# Patient Record
Sex: Female | Born: 1970 | Race: White | Hispanic: No | Marital: Married | Smoking: Former smoker
Health system: Southern US, Community
[De-identification: ages and names within clinical notes are randomized; demographics above are authoritative.]

## PROBLEM LIST (undated history)

## (undated) ENCOUNTER — Inpatient Hospital Stay: Admission: EM | Payer: Self-pay | Source: Home / Self Care

## (undated) DIAGNOSIS — L811 Chloasma: Secondary | ICD-10-CM

## (undated) DIAGNOSIS — F419 Anxiety disorder, unspecified: Secondary | ICD-10-CM

## (undated) DIAGNOSIS — E0500 Thyrotoxicosis with diffuse goiter without thyrotoxic crisis or storm: Secondary | ICD-10-CM

## (undated) DIAGNOSIS — L509 Urticaria, unspecified: Secondary | ICD-10-CM

## (undated) DIAGNOSIS — R87619 Unspecified abnormal cytological findings in specimens from cervix uteri: Secondary | ICD-10-CM

## (undated) DIAGNOSIS — C4491 Basal cell carcinoma of skin, unspecified: Secondary | ICD-10-CM

## (undated) DIAGNOSIS — M2550 Pain in unspecified joint: Secondary | ICD-10-CM

## (undated) DIAGNOSIS — C50919 Malignant neoplasm of unspecified site of unspecified female breast: Secondary | ICD-10-CM

## (undated) DIAGNOSIS — G43909 Migraine, unspecified, not intractable, without status migrainosus: Secondary | ICD-10-CM

## (undated) DIAGNOSIS — I341 Nonrheumatic mitral (valve) prolapse: Secondary | ICD-10-CM

## (undated) HISTORY — DX: Migraine, unspecified, not intractable, without status migrainosus: G43.909

## (undated) HISTORY — DX: Unspecified abnormal cytological findings in specimens from cervix uteri: R87.619

## (undated) HISTORY — DX: Thyrotoxicosis with diffuse goiter without thyrotoxic crisis or storm: E05.00

## (undated) HISTORY — DX: Pain in unspecified joint: M25.50

## (undated) HISTORY — DX: Urticaria, unspecified: L50.9

## (undated) HISTORY — DX: Basal cell carcinoma of skin, unspecified: C44.91

## (undated) HISTORY — DX: Nonrheumatic mitral (valve) prolapse: I34.1

## (undated) HISTORY — DX: Malignant neoplasm of unspecified site of unspecified female breast: C50.919

## (undated) HISTORY — DX: Chloasma: L81.1

## (undated) HISTORY — PX: WISDOM TOOTH EXTRACTION: SHX21

---

## 1997-12-31 DIAGNOSIS — E0500 Thyrotoxicosis with diffuse goiter without thyrotoxic crisis or storm: Secondary | ICD-10-CM

## 1997-12-31 HISTORY — DX: Thyrotoxicosis with diffuse goiter without thyrotoxic crisis or storm: E05.00

## 1998-12-16 ENCOUNTER — Ambulatory Visit (HOSPITAL_COMMUNITY): Admission: RE | Admit: 1998-12-16 | Discharge: 1998-12-16 | Payer: Self-pay | Admitting: Family Medicine

## 2000-02-29 HISTORY — PX: CRYOTHERAPY: SHX1416

## 2001-01-21 ENCOUNTER — Other Ambulatory Visit: Admission: RE | Admit: 2001-01-21 | Discharge: 2001-01-21 | Payer: Self-pay | Admitting: *Deleted

## 2001-05-31 DIAGNOSIS — R87619 Unspecified abnormal cytological findings in specimens from cervix uteri: Secondary | ICD-10-CM

## 2001-05-31 HISTORY — DX: Unspecified abnormal cytological findings in specimens from cervix uteri: R87.619

## 2001-06-02 ENCOUNTER — Other Ambulatory Visit: Admission: RE | Admit: 2001-06-02 | Discharge: 2001-06-02 | Payer: Self-pay | Admitting: Obstetrics and Gynecology

## 2001-06-02 ENCOUNTER — Other Ambulatory Visit: Admission: RE | Admit: 2001-06-02 | Discharge: 2001-06-02 | Payer: Self-pay | Admitting: *Deleted

## 2001-06-16 ENCOUNTER — Ambulatory Visit (HOSPITAL_COMMUNITY): Admission: RE | Admit: 2001-06-16 | Discharge: 2001-06-16 | Payer: Self-pay | Admitting: Endocrinology

## 2001-06-16 ENCOUNTER — Encounter: Payer: Self-pay | Admitting: Endocrinology

## 2001-11-07 ENCOUNTER — Other Ambulatory Visit: Admission: RE | Admit: 2001-11-07 | Discharge: 2001-11-07 | Payer: Self-pay | Admitting: *Deleted

## 2002-09-28 ENCOUNTER — Other Ambulatory Visit: Admission: RE | Admit: 2002-09-28 | Discharge: 2002-09-28 | Payer: Self-pay | Admitting: *Deleted

## 2003-03-29 ENCOUNTER — Other Ambulatory Visit: Admission: RE | Admit: 2003-03-29 | Discharge: 2003-03-29 | Payer: Self-pay | Admitting: *Deleted

## 2003-09-08 ENCOUNTER — Other Ambulatory Visit: Admission: RE | Admit: 2003-09-08 | Discharge: 2003-09-08 | Payer: Self-pay | Admitting: *Deleted

## 2004-04-05 ENCOUNTER — Other Ambulatory Visit: Admission: RE | Admit: 2004-04-05 | Discharge: 2004-04-05 | Payer: Self-pay | Admitting: *Deleted

## 2005-09-20 ENCOUNTER — Other Ambulatory Visit: Admission: RE | Admit: 2005-09-20 | Discharge: 2005-09-20 | Payer: Self-pay | Admitting: *Deleted

## 2007-01-10 ENCOUNTER — Other Ambulatory Visit: Admission: RE | Admit: 2007-01-10 | Discharge: 2007-01-10 | Payer: Self-pay | Admitting: Obstetrics & Gynecology

## 2008-06-30 ENCOUNTER — Other Ambulatory Visit: Admission: RE | Admit: 2008-06-30 | Discharge: 2008-06-30 | Payer: Self-pay | Admitting: Obstetrics & Gynecology

## 2009-11-23 LAB — TSH: TSH: 0 u[IU]/mL — AB (ref <=5.90)

## 2011-01-09 ENCOUNTER — Encounter: Payer: Self-pay | Admitting: Cardiovascular Disease

## 2011-01-09 DIAGNOSIS — E0500 Thyrotoxicosis with diffuse goiter without thyrotoxic crisis or storm: Secondary | ICD-10-CM | POA: Insufficient documentation

## 2011-02-01 ENCOUNTER — Ambulatory Visit: Payer: Self-pay | Admitting: Cardiovascular Disease

## 2011-04-24 ENCOUNTER — Encounter: Payer: Self-pay | Admitting: Cardiovascular Disease

## 2014-02-04 ENCOUNTER — Encounter: Payer: Self-pay | Admitting: Certified Nurse Midwife

## 2014-02-08 ENCOUNTER — Ambulatory Visit: Payer: Self-pay | Admitting: Certified Nurse Midwife

## 2015-09-09 ENCOUNTER — Ambulatory Visit: Payer: Self-pay | Admitting: Certified Nurse Midwife

## 2015-09-14 ENCOUNTER — Encounter: Payer: Self-pay | Admitting: Certified Nurse Midwife

## 2015-09-14 ENCOUNTER — Ambulatory Visit (INDEPENDENT_AMBULATORY_CARE_PROVIDER_SITE_OTHER): Payer: BLUE CROSS/BLUE SHIELD | Admitting: Certified Nurse Midwife

## 2015-09-14 VITALS — BP 98/64 | HR 68 | Resp 16 | Ht 66.25 in | Wt 150.0 lb

## 2015-09-14 DIAGNOSIS — Z01419 Encounter for gynecological examination (general) (routine) without abnormal findings: Secondary | ICD-10-CM

## 2015-09-14 DIAGNOSIS — N898 Other specified noninflammatory disorders of vagina: Secondary | ICD-10-CM

## 2015-09-14 DIAGNOSIS — E0500 Thyrotoxicosis with diffuse goiter without thyrotoxic crisis or storm: Secondary | ICD-10-CM

## 2015-09-14 DIAGNOSIS — N63 Unspecified lump in breast: Secondary | ICD-10-CM | POA: Diagnosis not present

## 2015-09-14 DIAGNOSIS — Z124 Encounter for screening for malignant neoplasm of cervix: Secondary | ICD-10-CM | POA: Diagnosis not present

## 2015-09-14 DIAGNOSIS — N6310 Unspecified lump in the right breast, unspecified quadrant: Secondary | ICD-10-CM

## 2015-09-14 DIAGNOSIS — Z0000 Encounter for general adult medical examination without abnormal findings: Secondary | ICD-10-CM | POA: Diagnosis not present

## 2015-09-14 LAB — POCT URINALYSIS DIPSTICK
Bilirubin, UA: NEGATIVE
Blood, UA: NEGATIVE
Glucose, UA: NEGATIVE
Ketones, UA: NEGATIVE
Leukocytes, UA: NEGATIVE
Nitrite, UA: NEGATIVE
Protein, UA: NEGATIVE
Urobilinogen, UA: NEGATIVE
pH, UA: 5

## 2015-09-14 NOTE — Progress Notes (Signed)
44 y.o. G0P0000 Married  Caucasian Fe here for annual exam. Periods some change with heavy first day, then light and finishes 2 days later. no issues. Contraception working well. Stressful year. Spouse diagnosed with colon cancer 2 B, negative lymph nodes with surgery and chemotherapy/ and herbal supplements and radiation to follow. No Lynch syndrome. Patient managing with support from family/friends. Sees PCP   Patient's last menstrual period was 09/07/2015.          Sexually active: Yes.    The current method of family planning is withdrawal.    Exercising: Yes.    walking & gardening Smoker:  no  Health Maintenance: Pap:  02-03-13 neg MMG: halo done within last yr with thermogram this year. Colonoscopy:  none BMD:   none TDaP: 2009 Labs: poct urine-neg Self breast exam: done monthly   reports that she has never smoked. She does not have any smokeless tobacco history on file. She reports that she does not drink alcohol or use illicit drugs.  Past Medical History  Diagnosis Date  . Grave's disease     GRAVES DISEASE  . Cancer     melanoma  . Migraines     without aura  . Abnormal Pap smear of cervix 6/02    +HPV HR, CIN1  . MVP (mitral valve prolapse)     neg cardio workup  . Melasma     Past Surgical History  Procedure Laterality Date  . Cryotherapy  3/01    CIN1    Current Outpatient Prescriptions  Medication Sig Dispense Refill  . CALCIUM PO Take by mouth as needed.    . Multiple Vitamins-Minerals (MULTIVITAMIN PO) Take by mouth daily.     No current facility-administered medications for this visit.    Family History  Problem Relation Age of Onset  . Hypertension Father   . Diabetes type II Father   . Hyperlipidemia Father   . Diabetes Father   . GER disease Brother   . Gout Brother   . Hypertension Brother   . Pulmonary embolism Maternal Grandfather     ROS:  Pertinent items are noted in HPI.  Otherwise, a comprehensive ROS was negative.  Exam:   BP  98/64 mmHg  Pulse 68  Resp 16  Ht 5' 6.25" (1.683 m)  Wt 150 lb (68.04 kg)  BMI 24.02 kg/m2  LMP 09/07/2015 Height: 5' 6.25" (168.3 cm) Ht Readings from Last 3 Encounters:  09/14/15 5' 6.25" (1.683 m)    General appearance: alert, cooperative and appears stated age Head: Normocephalic, without obvious abnormality, atraumatic Neck: no adenopathy, supple, symmetrical, trachea midline and thyroid normal to inspection and palpation Lungs: clear to auscultation bilaterally Breasts: normal appearance, no masses or tenderness, No nipple retraction or dimpling, No nipple discharge or bleeding, No axillary or supraclavicular adenopathy, positive findings: right breast mass pea size at 12 o'clock at aerola edge non tender,not mobile,  Heart: regular rate and rhythm Abdomen: soft, non-tender; no masses,  no organomegaly Extremities: extremities normal, atraumatic, no cyanosis or edema Skin: Skin color, texture, turgor normal. No rashes or lesions Lymph nodes: Cervical, supraclavicular, and axillary nodes normal. No abnormal inguinal nodes palpated Neurologic: Grossly normal   Pelvic: External genitalia:  no lesions              Urethra:  normal appearing urethra with no masses, tenderness or lesions              Bartholin's and Skene's: normal  Vagina: normal appearing vagina with increased redness at introitus and white non odorous discharge, no lesions Affirm taken              Cervix: normal,non tender,no lesions              Pap taken: Yes.   Bimanual Exam:  Uterus:  normal size, contour, position, consistency, mobility, non-tender              Adnexa: normal adnexa and no mass, fullness, tenderness               Rectovaginal: Confirms               Anus:  normal sphincter tone, no lesions  Chaperone present: yes  A:  Well Woman with normal exam  Contraception withdrawal  Right breast mass  R/O vaginal infection  History of Graves disease  Previous abnormal pap cin1  with cryo 2001  Social stress with spouse cancer  P:   Reviewed health and wellness pertinent to exam  Discussed finding and need for evaluation with [place] and diagnostic 3 D mammogram and [place]. Patient also needs screening on left breast. Patient agreeable to evaluation. Patient will be scheduled before leaving today.  Discussed aveeno bath for comfort and will advise if treatment needed when Affirm results in.  Patient requests referral to Endocrine, is unhappy with current care. Patient will be contacted with referral information.  Encouraged to seek family and friend and cancer center support group.  Pap smear as above with HPVHR   counseled on breast self exam, mammography screening, adequate intake of calcium and vitamin D, diet and exercise  return annually or prn  An After Visit Summary was printed and given to the patient.

## 2015-09-14 NOTE — Patient Instructions (Signed)

## 2015-09-14 NOTE — Progress Notes (Signed)
Reviewed personally.  M. Suzanne Leoni Goodness, MD.  

## 2015-09-14 NOTE — Progress Notes (Signed)
Scheduled patient for bilateral diagnostic mammogram with right breast ultrasound with [facility] on 09/19/2015 at 10:15 am. Agreeable to date and time. Placed in mammogram hold.

## 2015-09-15 LAB — WET PREP BY MOLECULAR PROBE
Candida species: NEGATIVE
Gardnerella vaginalis: NEGATIVE
Trichomonas vaginosis: NEGATIVE

## 2015-09-16 LAB — IPS PAP TEST WITH HPV

## 2015-09-20 ENCOUNTER — Other Ambulatory Visit: Payer: Self-pay | Admitting: Radiology

## 2015-09-21 ENCOUNTER — Other Ambulatory Visit: Payer: Self-pay

## 2015-09-21 ENCOUNTER — Other Ambulatory Visit: Payer: Self-pay | Admitting: Radiology

## 2015-09-21 DIAGNOSIS — C50911 Malignant neoplasm of unspecified site of right female breast: Secondary | ICD-10-CM

## 2015-09-22 ENCOUNTER — Encounter: Payer: Self-pay | Admitting: *Deleted

## 2015-09-22 NOTE — Progress Notes (Signed)
[place] Work  Clinical Social Work was referred by patient for assessment of psychosocial needs due to possible breast cancer diagnosis.  Clinical Social Worker spoke with pt via phone and then later met in person with met with patient at [facility] while husband receiving chemo to offer support and assess for needs.  CSW reviewed options for additional support/services for her at [facility]. Pt open to Loss adjuster, chartered after clinic visit, plans to attend support group Monday and meet with CSW to complete ADRs on 09/30/15. Pt able to openly communicate and advocate for her needs. CSW team to continue to follow and support.   Clinical Social Work interventions: Supportive listening Resource education  Amber Pugh, [place] Worker [place]  [place] Phone: [phone] Fax: [phone]

## 2015-09-23 ENCOUNTER — Ambulatory Visit
Admission: RE | Admit: 2015-09-23 | Discharge: 2015-09-23 | Disposition: A | Discharge status: Home / Self Care | Payer: BLUE CROSS/BLUE SHIELD | Source: Ambulatory Visit | Attending: Radiology | Admitting: Radiology

## 2015-09-23 ENCOUNTER — Encounter: Payer: Self-pay | Admitting: *Deleted

## 2015-09-23 ENCOUNTER — Telehealth: Payer: Self-pay | Admitting: *Deleted

## 2015-09-23 DIAGNOSIS — C50911 Malignant neoplasm of unspecified site of right female breast: Secondary | ICD-10-CM

## 2015-09-23 DIAGNOSIS — C50411 Malignant neoplasm of upper-outer quadrant of right female breast: Secondary | ICD-10-CM | POA: Insufficient documentation

## 2015-09-23 MED ORDER — GADOBENATE DIMEGLUMINE 529 MG/ML IV SOLN: 14.0000 mL | Freq: Once | INTRAVENOUS | Status: DC | PRN

## 2015-09-23 NOTE — Telephone Encounter (Signed)
Confirmed [facility] for 09/28/15 at 830am .  Instructions and contact information given.

## 2015-09-27 NOTE — Progress Notes (Signed)
[place]  Telephone:([phone] Fax:([phone]) [place] Note   Patient Care Team: Carlos Levering, PA-C as PCP - General (Family Medicine) Rolm Bookbinder, MD as Consulting Physician (General Surgery) Truitt Merle, MD as Consulting Physician (Hematology) Thea Silversmith, MD as Consulting Physician (Radiation Oncology) Mauro Kaufmann, RN as Registered Nurse Rockwell Germany, RN as Registered Nurse Sylvan Cheese, NP as Nurse Practitioner (Nurse Practitioner) 09/28/2015  CHIEF COMPLAINTS/PURPOSE OF CONSULTATION:  Newly diagnosed right breast cancer    Oncology History   Breast cancer of upper-outer quadrant of right female breast   Staging form: Breast, AJCC 7th Edition     Clinical stage from 09/20/2015: Stage IA (T1c, N0, M0) - Unsigned       Breast cancer of upper-outer quadrant of right female breast   09/20/2015 Initial Biopsy Right breast core needle biopsy 12:00 position showed invasive lobular carcinoma, second biopsy at 9:00 showed similar invasive lobular carcinoma, grade 1.   09/20/2015 Receptors her2 ER 70% positive, PR 90% positive, HER-2 negative, Ki-67 20%   09/20/2015 Initial Diagnosis Breast cancer of upper-outer quadrant of right female breast   09/26/2015 Imaging breast MRI confirmed biopsy-proven right breast 2 cm lesion at 12:00, and 1.3 cm lesion at 9:00 clock, and additional 0.9 cm non-mass enhancement at 6:00 position of the right breast, and oral enhancing mass at the 7:00 of the left breast measuring 0.7 cm    HISTORY OF PRESENTING ILLNESS:  Amber Pugh 44 y.o. female is here because of her recently diagnosed right breast cancer. She is accompanied by her parents in low to our multidisciplinary breast clinic today.  She went to see her GYN nurse practitioner a few weeks ago for routine follow-up, breast exam reviewed a palpable right breast mass at 12:00 position. Patient does not feel the mass before her visit.  This prompted a mammogram, which was her first mammogram in her life, on 09/19/2015. Which showed a 2.5 cm irregular hyperdense mass in the right breast 12:00 position, and a 8 mm mass at night clock position, which is suspicious for malignancy. She underwent ultrasound-guided needle biopsy of post the 12:00 and 9 clock position mass, both showed invasive lobular carcinoma. She was underwent breast MRI on 09/26/2015, which confirmed the biopsy-proven right breast cancer and 12 and 9 clock position, an additional a non-mass enhancement at the 6:00 position of right breast, and an oval enhancing mass at 7:00 of the left breast measuring 0.7 cm. The second look ultrasound of the left breast was negative for masses.  Her past medical history was positive for Graves' disease, which she was treated with medications and has been in remission for the past 3 years. She is very active, a true believer of healthy diet and herb supplement and nutritional supplement. Her husband was diagnosed with stage II colon cancer, and is currently undergoing adjuvant chemotherapy under my care. I've known her since 3 months ago.  MEDICAL HISTORY:  Past Medical History  Diagnosis Date  . Grave's disease     GRAVES DISEASE  . Migraines     without aura  . Abnormal Pap smear of cervix 6/02    +HPV HR, CIN1  . MVP (mitral valve prolapse)     neg cardio workup  . Melasma   . Basal cell carcinoma   . Breast cancer   . Graves disease   . Joint pain   . Graves disease 1999    she was treatmented with medications  3 times, in remission for the past 3 years ago    SURGICAL HISTORY: Past Surgical History  Procedure Laterality Date  . Cryotherapy  3/01    CIN1   GYN HISTORY  Menarchal: 15 LMP: 09/07/2015 Contraceptive: no HRT: no G0P0:   SOCIAL HISTORY: Social History   Social History  . Marital Status: Married    Spouse Name: N/A  . Number of Children: N/A  . Years of Education: N/A   Occupational History    . Not on file.   Social History Main Topics  . Smoking status: Never Smoker   . Smokeless tobacco: Not on file  . Alcohol Use: No  . Drug Use: No  . Sexual Activity:    Partners: Male     Comment: withdrawal   Other Topics Concern  . Not on file   Social History Narrative    FAMILY HISTORY: Family History  Problem Relation Age of Onset  . Hypertension Father   . Diabetes Father   . Gout Brother   . Hypertension Brother   . Pulmonary embolism Maternal Grandfather   . Diabetes Maternal Grandfather     ALLERGIES:  is allergic to cefdinir and tetracyclines & related.  MEDICATIONS:  Current Outpatient Prescriptions  Medication Sig Dispense Refill  . ASTRAGALUS PO Take 500 mg by mouth 2 (two) times daily.    . Multiple Vitamins-Minerals (MULTIVITAMIN PO) Take by mouth daily.    Jonna Coup Leaf Extract 500 MG CAPS Take 1 capsule by mouth 2 (two) times daily.    Marland Kitchen UNABLE TO FIND Rosemary , Tumeric, ginger  Two to Three times/day    . CALCIUM PO Take by mouth as needed.     No current facility-administered medications for this visit.    REVIEW OF SYSTEMS:   Constitutional: Denies fevers, chills or abnormal night sweats Eyes: Denies blurriness of vision, double vision or watery eyes Ears, nose, mouth, throat, and face: Denies mucositis or sore throat Respiratory: Denies cough, dyspnea or wheezes Cardiovascular: Denies palpitation, chest discomfort or lower extremity swelling Gastrointestinal:  Denies nausea, heartburn or change in bowel habits Skin: Denies abnormal skin rashes Lymphatics: Denies new lymphadenopathy or easy bruising Neurological:Denies numbness, tingling or new weaknesses Behavioral/Psych: Mood is stable, no new changes  All other systems were reviewed with the patient and are negative.  PHYSICAL EXAMINATION: ECOG PERFORMANCE STATUS: 0 - Asymptomatic  Filed Vitals:   09/28/15 0836  BP: 115/65  Pulse: 66  Temp: 98.4 F (36.9 C)  Resp: 20   Filed  Weights   09/28/15 0836  Weight: 147 lb 14.4 oz (67.087 kg)    GENERAL:alert, no distress and comfortable SKIN: skin color, texture, turgor are normal, no rashes or significant lesions EYES: normal, conjunctiva are pink and non-injected, sclera clear OROPHARYNX:no exudate, no erythema and lips, buccal mucosa, and tongue normal  NECK: supple, thyroid normal size, non-tender, without nodularity LYMPH:  no palpable lymphadenopathy in the cervical, axillary or inguinal LUNGS: clear to auscultation and percussion with normal breathing effort HEART: regular rate & rhythm and no murmurs and no lower extremity edema ABDOMEN:abdomen soft, non-tender and normal bowel sounds Musculoskeletal:no cyanosis of digits and no clubbing  PSYCH: alert & oriented x 3 with fluent speech NEURO: no focal motor/sensory deficits  LABORATORY DATA:  I have reviewed the data as listed Lab Results  Component Value Date   WBC 5.3 09/28/2015   HGB 14.3 09/28/2015   HCT 41.6 09/28/2015   MCV 90.2 09/28/2015   PLT  206 09/28/2015    Recent Labs  09/28/15 0818  NA 138  K 3.9  CO2 23  GLUCOSE 101  BUN 11.6  CREATININE 0.9  CALCIUM 9.1  PROT 7.5  ALBUMIN 4.1  AST 15  ALT 15  ALKPHOS 43  BILITOT 0.61    PATHOLOGY REPORT  Diagnosis 1. Breast, right, needle core biopsy, 12:00 o'clock - INVASIVE MAMMARY CARCINOMA - SEE COMMENT. 2. Breast, right, needle core biopsy, 9:00 o'clock - INVASIVE MAMMARY CARCINOMA. - SEE COMMENT. Microscopic Comment 1. The carcinoma appears grade 1 and has lobular features. An E-Cadherin stain will be performed and the results reported separately. A breast prognostic profile will be performed. The results were called to [facility] on 09/21/2015. 2. The carcinoma in part 2 is morphologically similar to that seen in part 1. (JBK:kh 09-21-15) The malignant cells are negative for E-Cadherin, supporting a lobular phenotype.  Results: IMMUNOHISTOCHEMICAL AND  MORPHOMETRIC ANALYSIS PERFORMED MANUALLY Estrogen Receptor: 70%, POSITIVE, STRONG STAINING INTENSITY Progesterone Receptor: 90%, POSITIVE, STRONG STAINING INTENSITY Proliferation Marker Ki67: 20%  1. FLUORESCENCE IN-SITU HYBRIDIZATION Results: HER2 - NEGATIVE RATIO OF HER2/CEP17 SIGNALS 1.29 AVERAGE HER2 COPY NUMBER PER CELL 2.00  RADIOGRAPHIC STUDIES: I have personally reviewed the radiological images as listed and agreed with the findings in the report.  Mr Breast Bilateral W Wo Contrast 09/26/2015   ADDENDUM REPORT: 09/26/2015 17:25 ADDENDUM: After further review, there is a tubular enhancing lesion at the skin surface of the upper inner right breast, best seen on the 3 minute postcontrast subtracted sequence (series 10, images 37 through 40). This may be a venous structure as it is not seen on the initial postcontrast 1 minute subtracted sequence. Other benign lesion such as a complicated sebaceous cyst is also a possibility. However, additional multicentric disease cannot be confidently excluded and I would recommend a second look targeted ultrasound of this area if breast conservation therapy for the right breast was being considered. Electronically Signed   By: Franki Cabot M.D.   On: 09/26/2015 17:25  09/26/2015    FINDINGS: Breast composition: c. Heterogeneous fibroglandular tissue.  Background parenchymal enhancement: Mild  Right breast:  There is an enhancing spiculated mass within the right breast at the 12 o'clock axis, at middle depth, with mixed kinetics (mostly persistent but some washout), measuring 1.2 x 1.2 x 2 cm (AP by transverse by craniocaudal dimensions), with associated biopsy clip artifact, consistent with KNOWN BIOPSY-PROVEN INVASIVE MAMMARY CARCINOMA (series 10, images 59 through 73).  There are several small satellite nodules along the inferior and posterior aspects of this biopsy-proven carcinoma, most distant satellite nodule being a 0.4 cm focus located 1.4 cm  posterior-lateral from the dominant mass within the upper outer quadrant (this furthest satellite seen on series 10, images 63 through 66).  There is an oval enhancing mass within the right breast at the 9 o'clock axis, at posterior depth, with mixed kinetics (mostly persistent), measuring 0.8 x 0.7 x 1.3 cm (transverse by AP by craniocaudal dimensions), with associated biopsy clip artifact, consistent with KNOWN BIOPSY-PROVEN INVASIVE MAMMARY CARCINOMA (series 10, image 81).  There is focal non mass enhancement within the right breast at the 6 o'clock axis, at posterior depth, with mixed kinetics (mostly persistent), measuring 0.9 x 0.4 cm, suspicious for additional multicentric disease (series 10, images 90 through 93).  There is no other area of suspicious mass or non mass enhancement within the right breast.  There are scattered cysts within the right breast.  Left breast: No  mass or abnormal enhancement.  There is an oval enhancing mass within the lower inner quadrant of the left breast, 7 o'clock axis region, at anterior depth, demonstrating sub threshold persistent kinetics, T2 hyperintense, measuring 0.7 x 0.4 x 0.7 cm (AP by transverse by craniocaudal dimensions), suspicious for contralateral disease (series 10, image 78).  There is no other area of suspicious mass or non mass enhancement within the left breast.  There are scattered cysts within the left breast.  Lymph nodes: There are no enlarged or morphologically abnormal lymph nodes identified within the bilateral axillary or internal mammary chain regions.  Ancillary findings:  None.   IMPRESSION: 1. BIOPSY-PROVEN INVASIVE MAMMARY CARCINOMA (spiculated enhancing mass) within the right breast at the 12 o'clock axis, at middle depth, measuring 1.2 x 1.2 x 2 cm, with biopsy clip. This corresponds to the venous clip on outside postprocedure mammogram of 09/20/2015. 2. BIOPSY-PROVEN INVASIVE MAMMARY CARCINOMA (oval enhancing mass) within the right breast  at the 9 o'clock axis, at posterior depth, measuring 0.8 x 0.7 x 1.3 cm, with associated biopsy clip. This corresponds to the ribbon clip on outside postprocedure mammogram of 09/20/2015. 3. Multiple small satellite nodules along the inferior and posterior aspects of the biopsy-proven carcinoma at the 12 o'clock axis, most distant satellite nodule measuring 0.4 cm located 1.4 cm posterior-lateral to the dominant mass. 4. Focal non mass enhancement within the RIGHT breast at the 6 o'clock axis, at posterior depth, measuring 0.9 x 0.4 cm, suspicious for additional multicentric disease, for which MRI-guided biopsy is recommended to defined furthest possible multicentric disease if such finding will affect treatment. 5. Oval enhancing mass within the LEFT breast, lower inner quadrant, 7 o'clock axis region, at anterior depth, measuring 0.7 x 0.4 x 0.7 cm, suspicious for contralateral disease, for which targeted second-look ultrasound is recommended with possible ultrasound-guided core biopsy. If no sonographic correlate were found, would then recommend MRI-guided biopsy. 6. On outside ultrasound ultrasound report of 09/19/2015, an additional mass was identified within the RIGHT breast at the 10 o'clock axis, 4 cm from the nipple, described as similar in appearance to the mass at the 9 o'clock axis (now a biopsy-proven carcinoma), measuring 0.4 x 0.4 x 0.4 cm. There is no MRI correlate identified today. No guidance given on the ultrasound or postprocedure mammogram report for this additional finding but I now recommend ultrasound-guided biopsy to define additional disease if such finding will affect treatment.  RECOMMENDATION: 1. MRI-guided core biopsy recommended for the focal non mass enhancement within the RIGHT breast at the 6 o'clock axis, at posterior depth, measuring 0.9 x 0.4 cm, suspicious for additional multicentric disease, if such finding will affect treatment. 2. Targeted second-look ultrasound, with  possible ultrasound-guided core biopsy, for an oval enhancing mass within the LEFT breast, lower inner quadrant, 7 o'clock axis region, at anterior depth, measuring 0.7 x 0.4 x 0.7 cm, suspicious for contralateral disease. If no sonographic correlate were found, MRI-guided core biopsy would then be recommended. 3. Ultrasound-guided core biopsy for an additional mass identified on outside ultrasound of 09/19/2015, located within the RIGHT breast at the 10 o'clock axis, 4 cm from the nipple, measuring 0.4 x 0.4 x 0.4 cm, described on outside report as similar in appearance to the mass at the 9 o'clock axis (now a biopsy-proven carcinoma).  BI-RADS CATEGORY  4: Suspicious.  Electronically Signed: By: Franki Cabot M.D. On: 09/25/2015 13:44    ASSESSMENT & PLAN: 44 year old premenopausal Caucasian female  1. Multifocal right breast invasive  lobular carcinoma, mcT2N0M0, stage II, G2, ER+/PR+/HER2- -I reviewed her last imaging findings and core needle biopsy results in great details with patient and her parents in[place]. -She has multifocal right breast cancer, and at least one additional indeterminate lesion in the right breast. Given the multi-focal location, and lobular carcinoma, we recommend right breast mastectomy and sentinel lymph node biopsy. She was seen by breast surgeon Dr. Donne Hazel today. -We recommend her to biopsy the left breast lesion under MRI guidance. -Given her young age, she will be qualified for genetic testing to ruled out inheritable breast cancer, although she does not have family history of breast or ovary cancer. -I recommend Oncotype DX test to further evaluate her risk of cancer recurrence after surgery, and the benefit of adjuvant chemotherapy. -Given her strong ER/PR positivity on her tumor cells, I strongly recommend adjuvant antiestrogen therapy. We discussed the option of tamoxifen or over and suppression plus aromatase inhibitor. The benefit and risks of each therapy were  discussed with her, and she is very interested. -She was also seen by radiation oncologist Dr. Pablo Ledger today.   2. Grave's disease -She'll continue follow-up with her primary care physician, she is in the process to find a new endocrinologist.  Plan -Genetic referral -MRI guided left breast mass biopsy -She is likely going to have a right mastectomy and sentinel lymph node biopsy.  -Dependent on the genetic test results and left breast mass biopsy results, we'll decide if she needs left breast surgery -Oncotype DX of the largest or highest grade breast tumor, if the sentinel lymph node negative -I'll see her back in 3 weeks after her breast surgery.  All questions were answered. The patient knows to call the clinic with any problems, questions or concerns. I spent 55 minutes counseling the patient face to face. The total time spent in the appointment was 60 minutes and more than 50% was on counseling.     Truitt Merle, MD 09/28/2015 6:00 PM

## 2015-09-28 ENCOUNTER — Other Ambulatory Visit: Payer: Self-pay | Admitting: Radiology

## 2015-09-28 ENCOUNTER — Encounter: Payer: Self-pay | Admitting: Skilled Nursing Facility1

## 2015-09-28 ENCOUNTER — Ambulatory Visit
Admission: RE | Admit: 2015-09-28 | Discharge: 2015-09-28 | Disposition: A | Discharge status: Home / Self Care | Payer: BLUE CROSS/BLUE SHIELD | Source: Ambulatory Visit | Attending: Radiation Oncology | Admitting: Radiation Oncology

## 2015-09-28 ENCOUNTER — Encounter: Payer: Self-pay | Admitting: Physical Therapy

## 2015-09-28 ENCOUNTER — Other Ambulatory Visit (HOSPITAL_BASED_OUTPATIENT_CLINIC_OR_DEPARTMENT_OTHER): Payer: BLUE CROSS/BLUE SHIELD

## 2015-09-28 ENCOUNTER — Encounter: Payer: Self-pay | Admitting: Hematology

## 2015-09-28 ENCOUNTER — Ambulatory Visit: Payer: BLUE CROSS/BLUE SHIELD | Attending: General Surgery | Admitting: Physical Therapy

## 2015-09-28 ENCOUNTER — Ambulatory Visit (HOSPITAL_BASED_OUTPATIENT_CLINIC_OR_DEPARTMENT_OTHER): Payer: BLUE CROSS/BLUE SHIELD | Admitting: Hematology

## 2015-09-28 ENCOUNTER — Encounter: Payer: Self-pay | Admitting: Nurse Practitioner

## 2015-09-28 VITALS — BP 115/65 | HR 66 | Temp 98.4°F | Resp 20 | Ht 66.25 in | Wt 147.9 lb

## 2015-09-28 DIAGNOSIS — Z170 Estrogen receptor positive status [ER+]: Secondary | ICD-10-CM

## 2015-09-28 DIAGNOSIS — R202 Paresthesia of skin: Secondary | ICD-10-CM | POA: Insufficient documentation

## 2015-09-28 DIAGNOSIS — E0789 Other specified disorders of thyroid: Secondary | ICD-10-CM

## 2015-09-28 DIAGNOSIS — M5382 Other specified dorsopathies, cervical region: Secondary | ICD-10-CM | POA: Diagnosis present

## 2015-09-28 DIAGNOSIS — C50911 Malignant neoplasm of unspecified site of right female breast: Secondary | ICD-10-CM | POA: Insufficient documentation

## 2015-09-28 DIAGNOSIS — C50811 Malignant neoplasm of overlapping sites of right female breast: Secondary | ICD-10-CM

## 2015-09-28 DIAGNOSIS — R200 Anesthesia of skin: Secondary | ICD-10-CM

## 2015-09-28 DIAGNOSIS — M629 Disorder of muscle, unspecified: Secondary | ICD-10-CM

## 2015-09-28 DIAGNOSIS — R293 Abnormal posture: Secondary | ICD-10-CM | POA: Diagnosis present

## 2015-09-28 DIAGNOSIS — C50411 Malignant neoplasm of upper-outer quadrant of right female breast: Secondary | ICD-10-CM

## 2015-09-28 DIAGNOSIS — R9389 Abnormal findings on diagnostic imaging of other specified body structures: Secondary | ICD-10-CM

## 2015-09-28 LAB — CBC WITH DIFFERENTIAL/PLATELET
BASO%: 0.8 % (ref 0.0–2.0)
Basophils Absolute: 0 10*3/uL (ref 0.0–0.1)
EOS%: 2.5 % (ref 0.0–7.0)
Eosinophils Absolute: 0.1 10*3/uL (ref 0.0–0.5)
HCT: 41.6 % (ref 34.8–46.6)
HEMOGLOBIN: 14.3 g/dL (ref 11.6–15.9)
LYMPH%: 37 % (ref 14.0–49.7)
MCH: 31 pg (ref 25.1–34.0)
MCHC: 34.4 g/dL (ref 31.5–36.0)
MCV: 90.2 fL (ref 79.5–101.0)
MONO#: 0.3 10*3/uL (ref 0.1–0.9)
MONO%: 6.3 % (ref 0.0–14.0)
NEUT%: 53.4 % (ref 38.4–76.8)
NEUTROS ABS: 2.8 10*3/uL (ref 1.5–6.5)
Platelets: 206 10*3/uL (ref 145–400)
RBC: 4.61 10*6/uL (ref 3.70–5.45)
RDW: 12.2 % (ref 11.2–14.5)
WBC: 5.3 10*3/uL (ref 3.9–10.3)
lymph#: 1.9 10*3/uL (ref 0.9–3.3)

## 2015-09-28 LAB — COMPREHENSIVE METABOLIC PANEL (CC13)
ALT: 15 U/L (ref 0–55)
AST: 15 U/L (ref 5–34)
Albumin: 4.1 g/dL (ref 3.5–5.0)
Alkaline Phosphatase: 43 U/L (ref 40–150)
Anion Gap: 8 mEq/L (ref 3–11)
BILIRUBIN TOTAL: 0.61 mg/dL (ref 0.20–1.20)
BUN: 11.6 mg/dL (ref 7.0–26.0)
CO2: 23 meq/L (ref 22–29)
CREATININE: 0.9 mg/dL (ref 0.6–1.1)
Calcium: 9.1 mg/dL (ref 8.4–10.4)
Chloride: 107 mEq/L (ref 98–109)
EGFR: 82 mL/min/{1.73_m2} — ABNORMAL LOW (ref >90)
GLUCOSE: 101 mg/dL (ref 70–140)
Potassium: 3.9 mEq/L (ref 3.5–5.1)
SODIUM: 138 meq/L (ref 136–145)
TOTAL PROTEIN: 7.5 g/dL (ref 6.4–8.3)

## 2015-09-28 NOTE — Therapy (Signed)
[place], [place], [address] Phone: [phone]   Fax:  [phone]  Physical Therapy Evaluation  Patient Details  Name: Amber Pugh MRN: [id number] Date of Birth: 03-14-1971 Referring [place] Baris:  Rolm Bookbinder, MD  Encounter Date: 09/28/2015      PT End of Session - 09/28/15 1253    Visit Number 1   Number of Visits 16   Date for PT Re-Evaluation 11/23/15   PT Start Time 1000   PT Stop Time 1016  Also saw pt from 1115-1136   PT Time Calculation (min) 16 min   Activity Tolerance Patient tolerated treatment well   Behavior During Therapy [facility] for tasks assessed/performed      Past Medical History  Diagnosis Date  . Grave's disease     GRAVES DISEASE  . Migraines     without aura  . Abnormal Pap smear of cervix 6/02    +HPV HR, CIN1  . MVP (mitral valve prolapse)     neg cardio workup  . Melasma   . Basal cell carcinoma   . Breast cancer   . Graves disease   . Joint pain   . Graves disease 1999    she was treatmented with medications 3 times, in remission for the past 3 years ago    Past Surgical History  Procedure Laterality Date  . Cryotherapy  3/01    CIN1    There were no vitals filed for this visit.  Visit Diagnosis:  Musculoskeletal disorder involving upper trapezius muscle - Plan: PT plan of care cert/re-cert  Breast cancer, right - Plan: PT plan of care cert/re-cert  Abnormal posture - Plan: PT plan of care cert/re-cert  Right arm numbness - Plan: PT plan of care cert/re-cert      Subjective Assessment - 09/28/15 1319    Pain Descriptors / Indicators Numbness;Tightness  Numbness extends at times from her scapula down into her right fingertips            [facility] PT Assessment - 09/28/15 0001    Assessment   Medical Diagnosis Right and possibly left breast cancer; right upper trap pain   Onset Date/Surgical Date 09/19/15   Hand Dominance Right   Prior Therapy none recent   Precautions   Precautions Other (comment)  Active cancer   Restrictions   Weight Bearing Restrictions No   Balance Screen   Has the patient fallen in the past 6 months No   Has the patient had a decrease in activity level because of a fear of falling?  No   Is the patient reluctant to leave their home because of a fear of falling?  No   Home Ecologist residence   Living Arrangements Spouse/significant other   Available Help at Discharge Family   Prior Function   Level of [place] Unemployed   Leisure She does alot of gardening, does yoga or [place] 2-3x/week and walks 2-3x/week for 30-45 minutes   Cognition   Overall Cognitive Status Within Functional Limits for tasks assessed   Posture/Postural Control   Posture/Postural Control Postural limitations   Postural Limitations Rounded Shoulders;Forward head   ROM / Strength   AROM / PROM / Strength AROM;Strength   AROM   AROM Assessment Site Shoulder;Cervical   Right/Left Shoulder Right;Left   Right Shoulder Extension 62 Degrees   Right Shoulder Flexion 151 Degrees   Right Shoulder ABduction 165 Degrees  Right Shoulder Internal Rotation 74 Degrees   Right Shoulder External Rotation 85 Degrees   Left Shoulder Extension 65 Degrees   Left Shoulder Flexion 158 Degrees   Left Shoulder ABduction 169 Degrees   Left Shoulder Internal Rotation 65 Degrees   Left Shoulder External Rotation 78 Degrees   Cervical Flexion WNL   Cervical Extension 25% limited   Cervical - Right Side Bend 25% limited   Cervical - Left Side Bend 25% limited   Cervical - Right Rotation 25% limited   Cervical - Left Rotation 25% limited   Strength   Overall Strength Within functional limits for tasks performed   Palpation   Spinal mobility Decreased P/A mobility at T1-T3   Palpation comment Hypersensitive to palpation right upper trap and rhomboid region.  decreased  scapular mobility on right side.   Distraction Test   Findngs Negative   Comment No increased pain or decreased pain with cervical compression or distraction   other    Findings Positive   Side Right   Comment Positive upper limb tension test           LYMPHEDEMA/ONCOLOGY QUESTIONNAIRE - 09/28/15 1223    Type   Cancer Type Right breast   Lymphedema Assessments   Lymphedema Assessments Upper extremities   Right Upper Extremity Lymphedema   10 cm Proximal to Olecranon Process 25.1 cm   Olecranon Process 24.7 cm   10 cm Proximal to Ulnar Styloid Process 21.5 cm   Just Proximal to Ulnar Styloid Process 14.5 cm   Across Hand at [employer] 17.8 cm   At [place] of 2nd Digit 5.8 cm   Left Upper Extremity Lymphedema   10 cm Proximal to Olecranon Process 25.6 cm   Olecranon Process 24.3 cm   10 cm Proximal to Ulnar Styloid Process 21.6 cm   Just Proximal to Ulnar Styloid Process 14.9 cm   Across Hand at [employer] 18.5 cm   At [place] of 2nd Digit 5.8 cm      Patient was instructed today in a home exercise program today for post op shoulder range of motion. These included active assist shoulder flexion in sitting, scapular retraction, wall walking with shoulder abduction, and hands behind head external rotation.  She was encouraged to do these twice a day, holding 3 seconds and repeating 5 times when permitted by her physician.         PT Education - 09/28/15 1251    Education provided Yes   Education Details Post op shoulder ROM HEP and lymphedema risk reduction   Person(s) Educated Patient   Methods Explanation;Demonstration;Verbal cues   Comprehension Returned demonstration;Verbalized understanding           Short Term Clinic Goals - 09/28/15 1321    CC Short Term Goal  #1   Title Patient will be able to perform initial home exercise program correctly and safely   Time 4   Period Weeks   Status New   CC Short Term Goal  #2   Title Patient will be able to  report right upper trap pain decreased >/= 25% to tolerate daily tasks with greater ease   Time 4   Period Weeks   Status New   CC Short Term Goal  #3   Title Patient will be able to report she is able to use her right arm with >/= 25% less difficulty   Time 4   Period Weeks   Status New   CC Short  Term Goal  #4   Title Patient will be able to report >/= 25% less numbness intensity and incidence in her right arm during the night.           Breast Clinic Goals - 09/28/15 1320    Patient will be able to verbalize understanding of pertinent lymphedema risk reduction practices relevant to her diagnosis specifically related to skin care.   Time 1   Period Days   Status Achieved   Patient will be able to return demonstrate and/or verbalize understanding of the post-op home exercise program related to regaining shoulder range of motion.   Time 1   Period Days   Status Achieved   Patient will be able to verbalize understanding of the importance of attending the postoperative After Breast Cancer Class for further lymphedema risk reduction education and therapeutic exercise.   Time 1   Period Days   Status Achieved          Long Term Clinic Goals - 09/28/15 1330    CC Long Term Goal  #1   Title Patient will be able to report right upper trap pain decreased >/= 50% to tolerate daily tasks with greater ease   Time 8   Period Weeks   Status New   CC Long Term Goal  #2   Title Patient will be able to report she is able to use her right arm with >/= 50% less difficulty   Time 8   Period Weeks   Status New   CC Long Term Goal  #3   Title Patient will be able to report >/= 25% less numbness intensity and incidence in her right arm during the night.   Time 8   Period Weeks   Status New            Plan - 09/28/15 1300    Clinical Impression Statement Patient was diagnosed with right lobular cancer with 3 masses located at 12:00, 9:00, and 10:00.  The 10:00 mass has not be yet  biopsied but the others are ER/PR positive and HER2 negative with a Ki67 of 20%.  The left side showed a 23m finding and she is going to have that biopsied.   She began having right upper trap and scapular pain 1-2 years ago but that has worsened over the past several months due to the stress of her husband's illness and now her own diagnosis.  She is planning to have the area on the left breast biopsied and a breast MRI.  She will undergo genetic testing.  She is planning to have a right mastectomy with a sentinel node biopsy and possibly immediate reconstruction.  She will possibly have right breast radiation.  Surgery (if any) for her left breast is to be determined.  Her right upper trap pain impedes her ability to use her right arm normally and she has significant neural tension.  She will benefit from PT pre-operatively to reduce right upper quadrant symptoms which should allow for an easier recovery after surgery.  If she undergoes a mastectomy and reconstruction, she will benefit from post op PT to regain shoulder ROM and strength and reduce lymphedema risk.   Pt will benefit from skilled therapeutic intervention in order to improve on the following deficits Decreased range of motion;Increased fascial restricitons;Pain;Impaired UE functional use;Decreased knowledge of precautions;Decreased strength;Increased muscle spasms   Rehab Potential Excellent   Clinical Impairments Affecting Rehab Potential Active breast cancer   PT Frequency 2x / week  PT Duration 8 weeks   PT Treatment/Interventions ADLs/Self Care Home Management;Traction;Passive range of motion;Patient/family education;Electrical Stimulation;Therapeutic exercise;Manual techniques;Moist Heat   PT Next Visit Plan Soft tissue work to right upper trap and rhomboid; passive right UE neural stretching; postural exercises   Consulted and Agree with Plan of Care Patient;Family member/caregiver   Family Member Consulted In-laws       Patient  will follow up at outpatient cancer rehab if needed following surgery.  If the patient requires physical therapy at that time, a new specific plan will be dictated and sent to the referring physician for approval. The patient was educated today on appropriate basic range of motion exercises to begin post operatively and the importance of attending the After Breast Cancer class following surgery.  Patient was educated today on lymphedema risk reduction practices as it pertains to recommendations that will benefit the patient immediately following surgery.  She verbalized good understanding.  Physical therapy for her right upper trap pain will begin on 10/03/15.    Problem List Patient Active Problem List   Diagnosis Date Noted  . Breast cancer of upper-outer quadrant of right female breast 09/23/2015  . Grave's disease     Annia Friendly, [place] 09/28/2015 1:34 PM  [place], [place], [address] Phone: [phone]   Fax:  [phone]

## 2015-09-28 NOTE — Progress Notes (Signed)
Subjective:     Patient ID: Amber Pugh, female   DOB: Oct 08, 1971, 44 y.o.   MRN: [id number]  HPI   Review of Systems     Objective:   Physical Exam For the patient to understand and be given the tools to implement a healthy plant based diet during their cancer diagnosis.     Assessment:     Patient was seen today and found to be and accompanied by her . Pts ht 5'6'' 147 pounds, and BMI 23.7. Pts current/relevant medications: womens vitamin, rosmary/tumeric/giner, olive leaf, and astralagus. Pt is currently seeing Ernestene Kiel with her husband who has colon cancer. Pt states she is allergic to over 40 foods including beans, nuts, seeds, and quinoa. Pt states she is healthy because she juices, does cleanses, and has an organic garden.     Plan:     Dietitian educated the patient on implementing a plant based diet by incorporating more plant proteins, fruits, and vegetables. As a part of a healthy routine physical activity was discussed. Dietitian discussed supplementation, the USP label, and possible interactions that may occur. The importance of legitimate, evidence based information was discussed and examples were given. A folder of evidence based information with a focus on a plant based diet and general nutrition during cancer was given to the patient.  As a part of the continuum of care the cancer dietitian's contact information was given to the patient in the event they would like to have a follow up appointment.

## 2015-09-28 NOTE — Patient Instructions (Signed)

## 2015-09-28 NOTE — Progress Notes (Signed)
Amber Pugh is a very pleasant 44 y.o. female from [place], [place] with newly diagnosed grade 1 invasive mammary carcinoma of the right breast.  Biopsy results revealed the tumor's prognostic profile is ER positive, PR positive, and HER2/neu negative. Ki67 is 20%.  She presents today with her in laws to the [facility]) for treatment consideration and recommendations from the breast surgeon, radiation oncologist, and medical oncologist.     I briefly met with Amber Pugh and her in laws during her [facility] visit today. We discussed the purpose of the Survivorship Clinic, which will include monitoring for recurrence, coordinating completion of age and gender-appropriate cancer screenings, promotion of overall wellness, as well as managing potential late/long-term side effects of anti-cancer treatments.    The treatment plan for Amber Pugh will likely include surgery +/- radiation therapy (pending type of surgery she pursues), and anti-estrogen therapy.  She will meet with the Genetics Counselor due to her age. As of today, the intent of treatment for Amber Pugh is cure, therefore she will be eligible for the Survivorship Clinic upon her completion of treatment.  Her survivorship care plan (SCP) document will be drafted and updated throughout the course of her treatment trajectory. She will receive the SCP in an office visit with myself in the Survivorship Clinic once she has completed treatment.   Amber Pugh was encouraged to ask questions and all questions were answered to her satisfaction.  She was given my business card and encouraged to contact me with any concerns regarding survivorship.  I look forward to participating in her care.   Kenn File, [place] [phone]

## 2015-09-28 NOTE — Progress Notes (Signed)
Radiation Oncology         [phone]) [phone] ________________________________  Initial Outpatient Consultation - Date: 09/28/2015   Name: Amber Pugh MRN: [id number]   DOB: 1971-10-06  REFERRING PHYSICIAN: Rolm Bookbinder, MD  DIAGNOSIS AND STAGE: Multifocal Grade I Invasive Lobular Carcinoma of the Right Breast  HISTORY OF PRESENT ILLNESS::Amber Pugh is a 44 y.o. female who palpated a right breast mass. She was found on mammogram to have a 2.5 cm mass in the 12 o'clock position. Ultrasound showed this mass as well as another 8 mm mass in the 9 o'clock position near the chest wall. Another 3 mm mass was noted at the 10 o'clock position 4 cm from the nipple. A biopsy was performed of the first 2 masses which showed Grade I Invasive Lobular Carcinoma ER/PR positive and HER2 negative. The biopsy clips are 6 cm apart. MRI showed a 1.2 cm mass at the 12 o'clock position with a few adjacent satellite lesions and an enhancing mass at the 9 o'clock position. In addition, there was a focal area of enhancement in the upper inner quadrant of the left breast measuring 7 mm. A second look ultrasound of this area was negative. This will require MRI biopsy. Her husband is currently undergoing treatment for colon cancer. She presents to me for my consideration of radiotherapy for the management of her disease.  PREVIOUS RADIATION THERAPY: No  Past medical, social and family history were reviewed in the electronic chart. Review of symptoms was reviewed in the electronic chart. Medications were reviewed in the electronic chart.   Gynecologic History  Age at first menstrual period? 15  Are you still having periods? Yes Approximate date of last period? 09/07/2015  If you are still having periods: Are your periods regular? Yes Obstetric History:  How many children have you carried to term? 0  Pregnant now or trying to get pregnant? No  Have you used birth control pills or hormone shots for  contraception? Yes  If so, for how long (or approximate dates)? Stopped 13 years ago. Took birth control for 33 years (age [age]) Health Maintenance:  Have you ever had a colonoscopy? No  Have you ever had a bone density? No  Date of your last PAP smear? 09/14/2015 Date of your FIRST mammogram? 09/19/2015  PHYSICAL EXAM: Vitals with BMI 09/28/2015  Height 5' 6.25"  Weight 147 lbs 14 oz  BMI 28.3  Systolic 151  Diastolic 65  Pulse 66  Respirations 20  Alert and oriented x 3. No breast exam was performed.  IMPRESSION: Multifocal Grade I Invasive Lobular Carcinoma of the Right Breast  PLAN: She will undergo genetic testing and an MRI guided biopsy of the left breast mass. She is interested in right nipple sparing mastectomy. She may ultimately choose a bilateral mastectomy based on genetic testing or biopsy of the left. I spoke with her with a recording device in the room that she would only require radiation if the tumor size was over 5 cm or her lymph nodes were positive in the post-mastectomy setting or if she had a lumpectomy on the left breast. I will see her back after surgery. It is possible she might not need radiation at all. I would speak with her family about that.  She also met with surgery, medical oncology as well as a member of our patient family support team, a dietitian, survivorship nurse practitioner, breast cancer navigator, and our physical therapist.  I spent 40 minutes  face to face  with the patient and more than 50% of that time was spent in counseling and/or coordination of care.   This document serves as a record of services personally performed by Thea Silversmith, MD. It was created on her behalf by Darcus Austin, a trained medical scribe. The creation of this record is based on the scribe's personal observations and the provider's statements to them. This document has been checked and approved by the attending provider.    ------------------------------------------------  Thea Silversmith, MD

## 2015-09-29 ENCOUNTER — Ambulatory Visit (HOSPITAL_BASED_OUTPATIENT_CLINIC_OR_DEPARTMENT_OTHER): Payer: BLUE CROSS/BLUE SHIELD | Admitting: Genetic Counselor

## 2015-09-29 ENCOUNTER — Encounter: Payer: Self-pay | Admitting: Genetic Counselor

## 2015-09-29 ENCOUNTER — Other Ambulatory Visit: Payer: Self-pay | Admitting: Radiology

## 2015-09-29 ENCOUNTER — Other Ambulatory Visit: Payer: BLUE CROSS/BLUE SHIELD

## 2015-09-29 ENCOUNTER — Ambulatory Visit: Admission: RE | Admit: 2015-09-29 | Payer: BLUE CROSS/BLUE SHIELD | Source: Ambulatory Visit

## 2015-09-29 ENCOUNTER — Ambulatory Visit
Admission: RE | Admit: 2015-09-29 | Discharge: 2015-09-29 | Disposition: A | Discharge status: Home / Self Care | Payer: BLUE CROSS/BLUE SHIELD | Source: Ambulatory Visit | Attending: Radiology | Admitting: Radiology

## 2015-09-29 DIAGNOSIS — R9389 Abnormal findings on diagnostic imaging of other specified body structures: Secondary | ICD-10-CM

## 2015-09-29 DIAGNOSIS — C50411 Malignant neoplasm of upper-outer quadrant of right female breast: Secondary | ICD-10-CM

## 2015-09-29 DIAGNOSIS — Z315 Encounter for genetic counseling: Secondary | ICD-10-CM

## 2015-09-29 NOTE — Progress Notes (Signed)
REFERRING PROVIDER: Carlos Levering, PA-C [place], [place] [id number]   Truitt Merle, MD  PRIMARY PROVIDER:  Carlos Levering, PA-C  PRIMARY REASON FOR VISIT:  1. Breast cancer of upper-outer quadrant of right female breast      HISTORY OF PRESENT ILLNESS:   Amber Pugh, a 44 y.o. female, was seen for a [place] cancer genetics consultation at the request of Dr. Burr Medico due to a personal history of cancer.  Amber Pugh presents to clinic today to discuss the possibility of a hereditary predisposition to cancer, genetic testing, and to further clarify her future cancer risks, as well as potential cancer risks for family members. Amber Pugh was diagnosed with Graves disease when she was 89.  Other than that, she has been very healthy.  In September 2016, at the age of 13, Amber Pugh was diagnosed with invasive lobular carcinoma of the right breast. ER+/PR+/Her2-.  Another site on her left breast is needing to be evaluated, and this was attempted this morningi with an MRI and contrast, but there were complications with the contrast.  This has been postponed until next week. Depending on her genetics and MRI result, she may consider bilateral mastectomy.  It is recommended that she have hormone therapy and she is unsure if this is something she wants.     CANCER HISTORY:  Oncology History   Breast cancer of upper-outer quadrant of right female breast   Staging form: Breast, AJCC 7th Edition     Clinical stage from 09/20/2015: Stage IA (T1c, N0, M0) - Unsigned       Breast cancer of upper-outer quadrant of right female breast   09/20/2015 Initial Biopsy Right breast core needle biopsy 12:00 position showed invasive lobular carcinoma, second biopsy at 9:00 showed similar invasive lobular carcinoma, grade 1.   09/20/2015 Receptors her2 ER 70% positive, PR 90% positive, HER-2 negative, Ki-67 20%   09/20/2015 Initial Diagnosis Breast cancer of upper-outer quadrant of right  female breast   09/26/2015 Imaging breast MRI confirmed biopsy-proven right breast 2 cm lesion at 12:00, and 1.3 cm lesion at 9:00 clock, and additional 0.9 cm non-mass enhancement at 6:00 position of the right breast, and oral enhancing mass at the 7:00 of the left breast measuring 0.7 cm     HORMONAL RISK FACTORS:  Menarche was at age 27.  First live birth at age N/A.  OCP use for approximately 12-13 years.  Ovaries intact: yes.  Hysterectomy: no.  Menopausal status: premenopausal.  HRT use: 0 years. Colonoscopy: no; not examined. Mammogram within the last year: yes. Number of breast biopsies: 1. Up to date with pelvic exams:  yes. Any excessive radiation exposure in the past:  no  Past Medical History  Diagnosis Date  . Grave's disease     GRAVES DISEASE  . Migraines     without aura  . Abnormal Pap smear of cervix 6/02    +HPV HR, CIN1  . MVP (mitral valve prolapse)     neg cardio workup  . Melasma   . Basal cell carcinoma   . Breast cancer   . Graves disease   . Joint pain   . Graves disease 1999    she was treatmented with medications 3 times, in remission for the past 3 years ago    Past Surgical History  Procedure Laterality Date  . Cryotherapy  3/01    CIN1    Social History   Social History  . Marital Status: Married  Spouse Name: N/A  . Number of Children: N/A  . Years of Education: N/A   Social History Main Topics  . Smoking status: Never Smoker   . Smokeless tobacco: None  . Alcohol Use: No  . Drug Use: No  . Sexual Activity:    Partners: Male     Comment: withdrawal   Other Topics Concern  . None   Social History Narrative     FAMILY HISTORY:  We obtained a detailed, 4-generation family history.  Significant diagnoses are listed below: Family History  Problem Relation Age of Onset  . Hypertension Father   . Diabetes Father   . Skin cancer Father     unknown type  . Gout Brother   . Hypertension Brother   . Pulmonary embolism  Maternal Grandfather   . Diabetes Maternal Grandfather   . Prostate cancer Other     MGMs brother dx in his 53s-80s   The patient does not have children.  She has one brother who is healthy. Both parents are living.  Her father had skin cancer from sun exposure, and her mother is healthy.  Her mother has three sisters who are all cancer free as are their children.  Her maternal grandparents are alive and in their 63s.  Her MGMs brother was diagnosed with prostate cancer abou t7-8 years ago.  Her father has 3-4 paternal half siblings who she does not have information about.  His mother died from natural causes.  Patient's maternal ancestors are of Caucasian descent, and paternal ancestors are of [place] descent. There is no reported Ashkenazi Jewish ancestry. There is no known consanguinity.  GENETIC COUNSELING ASSESSMENT: [name] is a 44 y.o. female with a personal history of breast cancer which somewhat suggestive of a hereditary cancer syndrome and predisposition to cancer. We, therefore, discussed and recommended the following at today's visit.   DISCUSSION: We discussed that approximately 5-10% of breast cancer is the result of a hereditary cancer syndrome.  Most commonly, BRCA mutations are implicated in early onset hereditary cases.  However, [name] has a lobular form of breast cancer which is not commonly seen with bRCA mutations. We discussed a possible increased risk for CDH1 mutations based on the lobular form of breast cancer.  We reviewed the characteristics, features and inheritance patterns of hereditary cancer syndromes. We also discussed genetic testing, including the appropriate family members to test, the process of testing, insurance coverage and turn-around-time for results. We discussed the implications of a negative, positive and/or variant of uncertain significant result. We recommended [name] pursue genetic testing for the Breast/Ovarian cancer gene panel. The  Breast/Ovarian gene panel offered by [facility] includes sequencing and rearrangement analysis for the following 20 genes:  ATM, BARD1, BRCA1, BRCA2, BRIP1, CDH1, CHEK2, EPCAM, FANCC, MLH1, MSH2, MSH6, NBN, PALB2, PMS2, PTEN, RAD51C, RAD51D, TP53, and XRCC2.     Based on Ms. Vo's personal history of cancer, she meets medical criteria for genetic testing. Despite that she meets criteria, she may still have an out of pocket cost. We discussed that if her out of pocket cost for testing is over $100, the laboratory will call and confirm whether she wants to proceed with testing.  If the out of pocket cost of testing is less than $100 she will be billed by the genetic testing laboratory.   PLAN: After considering the risks, benefits, and limitations, Ms. Ligas  provided informed consent to pursue genetic testing and the blood sample was sent to [facility] for analysis of the Breast/Ovarian cancer panel. We have asked that the results be placed on a [facility].  Results should be available within approximately 2 weeks' time, at which point they will be disclosed by telephone to Ms. Waites, as will any additional recommendations warranted by these results. Ms. Lenoir will receive a summary of her genetic counseling visit and a copy of her results once available. This information will also be available in Epic. We encouraged Ms. Laskin to remain in contact with cancer genetics annually so that we can continuously update the family history and inform her of any changes in cancer genetics and testing that may be of benefit for her family. Ms. Chahal questions were answered to her satisfaction today. Our contact information was provided should additional questions or concerns arise.  Lastly, we encouraged Ms. Kras to remain in contact with cancer genetics annually so that we can continuously update the family history and inform her of any changes in cancer genetics and testing that may be of benefit for this  family.   Ms.  Rotenberg questions were answered to her satisfaction today. Our contact information was provided should additional questions or concerns arise. Thank you for the referral and allowing [place] to share in the care of your patient.   Karen P. Florene Glen, [place], [facility] Certified Genetic Counselor [email or web address] phone: [phone]  The patient was seen for a total of 45 minutes in face-to-face genetic counseling.  This patient was discussed with Drs. Magrinat, Lindi Adie and/or Burr Medico who agrees with the above.    _______________________________________________________________________ For Office Staff:  Number of people involved in session: 2 Was an Intern/ student involved with case: no

## 2015-09-30 ENCOUNTER — Encounter: Payer: Self-pay | Admitting: *Deleted

## 2015-09-30 NOTE — Progress Notes (Signed)
[place] Social Work  Clinical Social Work was referred by pt  to review and complete healthcare advance directives.  Clinical Social Worker met with patient in [place] office.  The patient designated her husband, Ibeth Fahmy as their primary healthcare agent and Harrietta Guardian as their secondary agent.  Patient also completed healthcare living will.    Clinical Social Worker notarized documents and made copies for patient/family. Clinical Social Worker will send documents to medical records to be scanned into patient's chart. Clinical Social Worker encouraged patient/family to contact with any additional questions or concerns.  CSW also met with pt to provide support due to recent breast cancer diagnosis. Pt open to additional support and will met with CSW next week for further counseling.  Loren Racer, [place] Worker [place]  [place] Phone: [phone] Fax: [phone]

## 2015-10-03 ENCOUNTER — Telehealth: Payer: Self-pay | Admitting: *Deleted

## 2015-10-03 ENCOUNTER — Encounter: Payer: Self-pay | Admitting: *Deleted

## 2015-10-03 ENCOUNTER — Ambulatory Visit: Payer: BLUE CROSS/BLUE SHIELD | Attending: General Surgery

## 2015-10-03 DIAGNOSIS — R293 Abnormal posture: Secondary | ICD-10-CM | POA: Diagnosis present

## 2015-10-03 DIAGNOSIS — R200 Anesthesia of skin: Secondary | ICD-10-CM

## 2015-10-03 DIAGNOSIS — M5382 Other specified dorsopathies, cervical region: Secondary | ICD-10-CM | POA: Diagnosis not present

## 2015-10-03 DIAGNOSIS — C50911 Malignant neoplasm of unspecified site of right female breast: Secondary | ICD-10-CM | POA: Insufficient documentation

## 2015-10-03 DIAGNOSIS — M629 Disorder of muscle, unspecified: Secondary | ICD-10-CM

## 2015-10-03 DIAGNOSIS — R202 Paresthesia of skin: Secondary | ICD-10-CM | POA: Insufficient documentation

## 2015-10-03 NOTE — Telephone Encounter (Signed)
Left vm for pt to return call regarding [place] from 9/28.

## 2015-10-03 NOTE — Telephone Encounter (Signed)
Spoke to pt concerning [place] from 09/28/15. Denies questions or concerns regarding dx or treatment care plan. Encourage pt to call with needs. Received verbal understanding. Contact information given.

## 2015-10-03 NOTE — Therapy (Signed)
[place], [place], [address] Phone: [phone]   Fax:  [phone]  Physical Therapy Treatment  Patient Details  Name: Amber Pugh MRN: [id number] Date of Birth: [birth year] Referring Provider:  Rolm Bookbinder, MD  Encounter Date: 10/03/2015      PT End of Session - 10/03/15 0851    Visit Number 2   Number of Visits 16   Date for PT Re-Evaluation 11/23/15   PT Start Time 0801   PT Stop Time 0904   PT Time Calculation (min) 63 min   Activity Tolerance Patient tolerated treatment well   Behavior During Therapy [facility] for tasks assessed/performed      Past Medical History  Diagnosis Date  . Grave's disease     GRAVES DISEASE  . Migraines     without aura  . Abnormal Pap smear of cervix 6/02    +HPV HR, CIN1  . MVP (mitral valve prolapse)     neg cardio workup  . Melasma   . Basal cell carcinoma   . Breast cancer   . Graves disease   . Joint pain   . Graves disease 1999    she was treatmented with medications 3 times, in remission for the past 3 years ago    Past Surgical History  Procedure Laterality Date  . Cryotherapy  3/01    CIN1    There were no vitals filed for this visit.  Visit Diagnosis:  Musculoskeletal disorder involving upper trapezius muscle  Abnormal posture  Right arm numbness  Breast cancer, female, right      Subjective Assessment - 10/03/15 0805    Subjective I was supposed to have an MRI last Thursday but I "blew a vein" when they injected me with the contrast. So they did the MRI but they will have to do it again. Then I was using a power tool last night and think I flared it up bc it was really sore yesterday. But it feels better today, just a little sore in my upper arm.   Currently in Pain? Yes   Pain Score 4   On Tylenol   Pain Location Arm   Pain Orientation Right   Pain Descriptors / Indicators Shooting   Pain Type Acute pain   Pain  Onset In the past 7 days   Aggravating Factors  Started when her "vein blew out" Thursday   Pain Relieving Factors rest                         [facility] Adult PT Treatment/Exercise - 10/03/15 0001    Moist Heat Therapy   Number Minutes Moist Heat 15 Minutes   Moist Heat Location Other (comment)  Rt upper trap/rhomboid area with IFC   Electrical Stimulation   Electrical Stimulation Location Rt upper trap, anterior shoulder and medial scapula border   Electrical Stimulation Action IFC   Electrical Stimulation Parameters 80-150 Hz x 15 mins   Electrical Stimulation Goals Pain   Manual Therapy   Myofascial Release Did not perform to Rt chest wall as pt reports very tender due to 2 seromas from biopsies; gentle UE pulling throughout PROM   Passive ROM To Rt shoulder into flexion, abduction and D2 and neural tension stretch all to pts tolerance and very gently.                   [facility] Goals - 09/28/15 1321  CC Short Term Goal  #1   Title Patient will be able to perform initial home exercise program correctly and safely   Time 4   Period Weeks   Status New   CC Short Term Goal  #2   Title Patient will be able to report right upper trap pain decreased >/= 25% to tolerate daily tasks with greater ease   Time 4   Period Weeks   Status New   CC Short Term Goal  #3   Title Patient will be able to report she is able to use her right arm with >/= 25% less difficulty   Time 4   Period Weeks   Status New   CC Short Term Goal  #4   Title Patient will be able to report >/= 25% less numbness intensity and incidence in her right arm during the night.           Breast Clinic Goals - 09/28/15 1320    Patient will be able to verbalize understanding of pertinent lymphedema risk reduction practices relevant to her diagnosis specifically related to skin care.   Time 1   Period Days   Status Achieved   Patient will be able to return demonstrate and/or  verbalize understanding of the post-op home exercise program related to regaining shoulder range of motion.   Time 1   Period Days   Status Achieved   Patient will be able to verbalize understanding of the importance of attending the postoperative After Breast Cancer Class for further lymphedema risk reduction education and therapeutic exercise.   Time 1   Period Days   Status Achieved          Long Term Clinic Goals - 09/28/15 1330    CC Long Term Goal  #1   Title Patient will be able to report right upper trap pain decreased >/= 50% to tolerate daily tasks with greater ease   Time 8   Period Weeks   Status New   CC Long Term Goal  #2   Title Patient will be able to report she is able to use her right arm with >/= 50% less difficulty   Time 8   Period Weeks   Status New   CC Long Term Goal  #3   Title Patient will be able to report >/= 25% less numbness intensity and incidence in her right arm during the night.   Time 8   Period Weeks   Status New            Plan - 10/03/15 0906    Clinical Impression Statement Pt had a "vein blow" when they injected her with contrast for her MRI Thursday but reports was cleared to use heat on her arm Saturday so heat was applied per flowsheet with IFC and pt reported this feeling good. Tolerated treatment well though was still very tender at anterior elbow/upper arm where at vein.    Pt will benefit from skilled therapeutic intervention in order to improve on the following deficits Decreased range of motion;Increased fascial restricitons;Pain;Impaired UE functional use;Decreased knowledge of precautions;Decreased strength;Increased muscle spasms   Rehab Potential Excellent   Clinical Impairments Affecting Rehab Potential Active breast cancer   PT Frequency 2x / week   PT Duration 8 weeks   PT Treatment/Interventions ADLs/Self Care Home Management;Traction;Passive range of motion;Patient/family education;Electrical Stimulation;Therapeutic  exercise;Manual techniques;Moist Heat   PT Next Visit Plan Cont soft tissue work to right upper trap and rhomboid; passive right UE  neural stretching; postural exercises and assess modalities/cont if pt felt beneficial   Consulted and Agree with Plan of Care Patient        Problem List Patient Active Problem List   Diagnosis Date Noted  . Breast cancer of upper-outer quadrant of right female breast ([place]) 09/23/2015  . Grave's disease     Otelia Limes, PTA 10/03/2015, 10:29 AM  [place], [place], [address] Phone: [phone]   Fax:  [phone]

## 2015-10-03 NOTE — Progress Notes (Signed)
Clinical Social Work [facility] Psychosocial Distress Screening [facility]  Patient completed distress screening protocol and scored a 4 on the Psychosocial Distress Thermometer which indicates mild distress. Clinical Social Worker met with patient and patient's family in [facility] to assess for distress and other psychosocial needs. Patient stated she was doing "ok" and felt comfortable with her treatment plan and treatment team. CSW and patient discussed common feeling and emotions when being diagnosed with cancer, and the importance of support during treatment. CSW informed patient of the support team and support services at [facility], and patient was agreeable to an [place] guide. CSW provided contact information and encouraged patient to call with any questions or concerns.  [facility] DISTRESS SCREENING 10/03/2015  Screening Type Initial Screening  Distress experienced in past week (1-10) 4  Emotional problem type Nervousness/Anxiety  Physician notified of physical symptoms Yes  Referral to clinical psychology No  Referral to clinical social work Yes  Referral to dietition No  Referral to financial advocate No  Referral to support programs Yes  Referral to palliative care No   Abigail Elmore, MSW, LCSW, OSW-C Clinical Social Worker [facility] [phone]        

## 2015-10-04 ENCOUNTER — Ambulatory Visit
Admission: RE | Admit: 2015-10-04 | Discharge: 2015-10-04 | Disposition: A | Discharge status: Home / Self Care | Payer: BLUE CROSS/BLUE SHIELD | Source: Ambulatory Visit | Attending: Radiology | Admitting: Radiology

## 2015-10-04 DIAGNOSIS — R9389 Abnormal findings on diagnostic imaging of other specified body structures: Secondary | ICD-10-CM

## 2015-10-04 MED ORDER — GADOBENATE DIMEGLUMINE 529 MG/ML IV SOLN: 13.0000 mL | Freq: Once | INTRAVENOUS | Status: DC | PRN

## 2015-10-05 ENCOUNTER — Ambulatory Visit: Payer: BLUE CROSS/BLUE SHIELD

## 2015-10-05 DIAGNOSIS — C50911 Malignant neoplasm of unspecified site of right female breast: Secondary | ICD-10-CM

## 2015-10-05 DIAGNOSIS — M5382 Other specified dorsopathies, cervical region: Secondary | ICD-10-CM | POA: Diagnosis not present

## 2015-10-05 DIAGNOSIS — R200 Anesthesia of skin: Secondary | ICD-10-CM

## 2015-10-05 DIAGNOSIS — M629 Disorder of muscle, unspecified: Secondary | ICD-10-CM

## 2015-10-05 DIAGNOSIS — R293 Abnormal posture: Secondary | ICD-10-CM

## 2015-10-05 NOTE — Therapy (Signed)
[place], [place], [address] Phone: [phone]   Fax:  [phone]  Physical Therapy Treatment  Patient Details  Name: Amber Pugh MRN: [id number] Date of Birth: 1971-03-17 Referring Provider:  Rolm Bookbinder, MD  Encounter Date: 10/05/2015      PT End of Session - 10/05/15 1020    Visit Number 3   Number of Visits 16   Date for PT Re-Evaluation 11/23/15   PT Start Time 0939   PT Stop Time 1033   PT Time Calculation (min) 54 min   Activity Tolerance Patient tolerated treatment well   Behavior During Therapy [facility] for tasks assessed/performed      Past Medical History  Diagnosis Date  . Grave's disease     GRAVES DISEASE  . Migraines     without aura  . Abnormal Pap smear of cervix 6/02    +HPV HR, CIN1  . MVP (mitral valve prolapse)     neg cardio workup  . Melasma   . Basal cell carcinoma   . Breast cancer   . Graves disease   . Joint pain   . Graves disease 1999    she was treatmented with medications 3 times, in remission for the past 3 years ago    Past Surgical History  Procedure Laterality Date  . Cryotherapy  3/01    CIN1    There were no vitals filed for this visit.  Visit Diagnosis:  Musculoskeletal disorder involving upper trapezius muscle  Abnormal posture  Right arm numbness  Breast cancer, female, right      Subjective Assessment - 10/05/15 0942    Subjective I had my Lt breast MRI biopsy yesterday so I'm a little sore today on that side, but my Rt breast is actually more sore from removing the gauze. My Rt shoulder/scapular area felt great, but my bil periscapular was a little sore but I've had that happen before after massages. Once the Rt shoulder relaxes, the scapular area gets more sore.  My Rt shoulder already is feeling better, I was able to relax on the table for the MRI yesterday and was glad of that.    Currently in Pain? No/denies                          [facility] Adult PT Treatment/Exercise - 10/05/15 0001    Moist Heat Therapy   Number Minutes Moist Heat 15 Minutes   Moist Heat Location Other (comment)  Bil upper back   Electrical Stimulation   Electrical Stimulation Location Bil cervical muscles/medial scapular borders   Electrical Stimulation Action IFC   Electrical Stimulation Parameters 80-150 Hz x 15 minutes   Electrical Stimulation Goals Pain   Manual Therapy   Soft tissue mobilization In Lt S/L: To Rt rhomboid/upper trap area with trigger point release to medial scapular border (performed this last session but forgot to document)   Passive ROM To Rt shoulder into flexion, abduction and D2 and neural tension stretch all to pts tolerance and very gently.                   Short Term Clinic Goals - 10/05/15 1022    CC Short Term Goal  #1   Title Patient will be able to perform initial home exercise program correctly and safely   Status On-going   CC Short Term Goal  #2   Title Patient will be able to  report right upper trap pain decreased >/= 25% to tolerate daily tasks with greater ease   Status On-going   CC Short Term Goal  #3   Title Patient will be able to report she is able to use her right arm with >/= 25% less difficulty   CC Short Term Goal  #4   Title Patient will be able to report >/= 25% less numbness intensity and incidence in her right arm during the night.   Status On-going           Breast Clinic Goals - 09/28/15 1320    Patient will be able to verbalize understanding of pertinent lymphedema risk reduction practices relevant to her diagnosis specifically related to skin care.   Time 1   Period Days   Status Achieved   Patient will be able to return demonstrate and/or verbalize understanding of the post-op home exercise program related to regaining shoulder range of motion.   Time 1   Period Days   Status Achieved   Patient will be able to verbalize  understanding of the importance of attending the postoperative After Breast Cancer Class for further lymphedema risk reduction education and therapeutic exercise.   Time 1   Period Days   Status Achieved          Long Term Clinic Goals - 09/28/15 1330    CC Long Term Goal  #1   Title Patient will be able to report right upper trap pain decreased >/= 50% to tolerate daily tasks with greater ease   Time 8   Period Weeks   Status New   CC Long Term Goal  #2   Title Patient will be able to report she is able to use her right arm with >/= 50% less difficulty   Time 8   Period Weeks   Status New   CC Long Term Goal  #3   Title Patient will be able to report >/= 25% less numbness intensity and incidence in her right arm during the night.   Time 8   Period Weeks   Status New            Plan - 10/05/15 1020    Clinical Impression Statement Pt tolerated treatment very well today reporting less tenderness noted today. Her PROM was improved as well and pts end ROM was looser today. Continued with modalities incorporating both sides today, see flowsheet.   Pt will benefit from skilled therapeutic intervention in order to improve on the following deficits Decreased range of motion;Increased fascial restricitons;Pain;Impaired UE functional use;Decreased knowledge of precautions;Decreased strength;Increased muscle spasms   Rehab Potential Excellent   Clinical Impairments Affecting Rehab Potential Active breast cancer   PT Frequency 2x / week   PT Duration 8 weeks   PT Treatment/Interventions ADLs/Self Care Home Management;Traction;Passive range of motion;Patient/family education;Electrical Stimulation;Therapeutic exercise;Manual techniques;Moist Heat   PT Next Visit Plan Cont soft tissue work to right upper trap and rhomboid; passive right UE neural stretching; postural exercises and cont modalities   Consulted and Agree with Plan of Care Patient        Problem List Patient Active  Problem List   Diagnosis Date Noted  . Breast cancer of upper-outer quadrant of right female breast ([place]) 09/23/2015  . Grave's disease     Otelia Limes, [place] 10/05/2015, 10:24 AM  [place], [place], [address] Phone: [phone]   Fax:  [phone]

## 2015-10-06 ENCOUNTER — Telehealth: Payer: Self-pay | Admitting: Genetic Counselor

## 2015-10-06 ENCOUNTER — Ambulatory Visit: Payer: Self-pay | Admitting: Genetic Counselor

## 2015-10-06 ENCOUNTER — Encounter: Payer: Self-pay | Admitting: Genetic Counselor

## 2015-10-06 DIAGNOSIS — Z1379 Encounter for other screening for genetic and chromosomal anomalies: Secondary | ICD-10-CM

## 2015-10-06 DIAGNOSIS — C50411 Malignant neoplasm of upper-outer quadrant of right female breast: Secondary | ICD-10-CM

## 2015-10-06 NOTE — Telephone Encounter (Signed)
Revealed negative genetic testing on the Breast/OVarian cancer panel.

## 2015-10-06 NOTE — Progress Notes (Signed)
HPI: Amber Pugh was previously seen in the [facility] due to a personal history of breast cancer and concerns regarding a hereditary predisposition to cancer. Please refer to our prior cancer genetics clinic note for more information regarding Amber Pugh's medical, social and family histories, and our assessment and recommendations, at the time. Amber Pugh recent genetic test results were disclosed to her, as were recommendations warranted by these results. These results and recommendations are discussed in more detail below.  FAMILY HISTORY:  We obtained a detailed, 4-generation family history.  Significant diagnoses are listed below: Family History  Problem Relation Age of Onset  . Hypertension Father   . Diabetes Father   . Skin cancer Father     unknown type  . Gout Brother   . Hypertension Brother   . Pulmonary embolism Maternal Grandfather   . Diabetes Maternal Grandfather   . Prostate cancer Other     MGMs brother dx in his 6s-80s    The patient does not have children. She has one brother who is healthy. Both parents are living. Her father had skin cancer from sun exposure, and her mother is healthy. Her mother has three sisters who are all cancer free as are their children. Her maternal grandparents are alive and in their 42s. Her MGMs brother was diagnosed with prostate cancer abou t7-8 years ago. Her father has 3-4 paternal half siblings who she does not have information about. His mother died from natural causes. Patient's maternal ancestors are of Caucasian descent, and paternal ancestors are of [place] descent. There is no reported Ashkenazi Jewish ancestry. There is no known consanguinity.  GENETIC TEST RESULTS: At the time of Amber Pugh visit, we recommended she pursue genetic testing of the Breast/Ovarian cancer gene panel. The Breast/Ovarian gene panel offered by [facility] includes sequencing and rearrangement analysis for the following 20 genes:   ATM, BARD1, BRCA1, BRCA2, BRIP1, CDH1, CHEK2, EPCAM, FANCC, MLH1, MSH2, MSH6, NBN, PALB2, PMS2, PTEN, RAD51C, RAD51D, TP53, and XRCC2.    The report date is October 06, 2015.  Genetic testing was normal, and did not reveal a deleterious mutation in these genes. The test report has been scanned into EPIC and is located under the Molecular Pathology section of the Results Review tab.   We discussed with Amber Pugh that since the current genetic testing is not perfect, it is possible there may be a gene mutation in one of these genes that current testing cannot detect, but that chance is small. We also discussed, that it is possible that another gene that has not yet been discovered, or that we have not yet tested, is responsible for the cancer diagnoses in the family, and it is, therefore, important to remain in touch with cancer genetics in the future so that we can continue to offer Amber Pugh the most up to date genetic testing.   CANCER SCREENING RECOMMENDATIONS: This result is reassuring and indicates that Amber Pugh likely does not have an increased risk for a future cancer due to a mutation in one of these genes. This normal test also suggests that Amber Pugh's cancer was most likely not due to an inherited predisposition associated with one of these genes.  Most cancers happen by chance and this negative test suggests that her cancer falls into this category.  We, therefore, recommended she continue to follow the cancer management and screening guidelines provided by her oncology and primary healthcare provider.   RECOMMENDATIONS FOR FAMILY MEMBERS: Women in  this family might be at some increased risk of developing cancer, over the general population risk, simply due to the family history of cancer. We recommended women in this family have a yearly mammogram beginning at age 62, or 48 years younger than the earliest onset of cancer, an an annual clinical breast exam, and perform monthly breast  self-exams. Women in this family should also have a gynecological exam as recommended by their primary provider. All family members should have a colonoscopy by age 110.  FOLLOW-UP: Lastly, we discussed with Amber Pugh that cancer genetics is a rapidly advancing field and it is possible that new genetic tests will be appropriate for her and/or her family members in the future. We encouraged her to remain in contact with cancer genetics on an annual basis so we can update her personal and family histories and let her know of advances in cancer genetics that may benefit this family.   Our contact number was provided. Amber Pugh questions were answered to her satisfaction, and she knows she is welcome to call us at anytime with additional questions or concerns.   Roma Kayser, MS, [facility] Certified Genetic Counselor [email or web address]

## 2015-10-07 ENCOUNTER — Ambulatory Visit: Payer: BLUE CROSS/BLUE SHIELD | Admitting: Physical Therapy

## 2015-10-07 ENCOUNTER — Encounter: Payer: Self-pay | Admitting: *Deleted

## 2015-10-07 DIAGNOSIS — M629 Disorder of muscle, unspecified: Secondary | ICD-10-CM

## 2015-10-07 DIAGNOSIS — M5382 Other specified dorsopathies, cervical region: Secondary | ICD-10-CM

## 2015-10-07 DIAGNOSIS — R293 Abnormal posture: Secondary | ICD-10-CM

## 2015-10-07 NOTE — Progress Notes (Signed)
[facility] Work  Clinical Social Work met with pt for counseling session for ongoing processing of diagnosis, adjustment to illness. Supportive listening provided. Pt plans to return for another session on 10/14/15 for additional support. Pt agrees to reach out as needed.    Clinical Social Work interventions: Counseling session  Loren Racer, [place] Worker [place] Phone: [phone] Fax: [phone]

## 2015-10-07 NOTE — Therapy (Signed)
[place], [place], [address] Phone: [phone]   Fax:  [phone]  Physical Therapy Treatment  Patient Details  Name: Amber Pugh MRN: [id number] Date of Birth: [birth year] Referring Provider:  Rolm Bookbinder, MD  Encounter Date: 10/07/2015      PT End of Session - 10/07/15 1253    Visit Number 4   Number of Visits 16   Date for PT Re-Evaluation 11/23/15   PT Start Time 5277   PT Stop Time 1120   PT Time Calculation (min) 65 min   Activity Tolerance Patient tolerated treatment well   Behavior During Therapy [facility] for tasks assessed/performed      Past Medical History  Diagnosis Date  . Grave's disease     GRAVES DISEASE  . Migraines     without aura  . Abnormal Pap smear of cervix 6/02    +HPV HR, CIN1  . MVP (mitral valve prolapse)     neg cardio workup  . Melasma   . Basal cell carcinoma   . Breast cancer   . Graves disease   . Joint pain   . Graves disease 1999    she was treatmented with medications 3 times, in remission for the past 3 years ago    Past Surgical History  Procedure Laterality Date  . Cryotherapy  3/01    CIN1    There were no vitals filed for this visit.  Visit Diagnosis:  Musculoskeletal disorder involving upper trapezius muscle  Abnormal posture      Subjective Assessment - 10/07/15 1251    Subjective Pt report her breasts are still sore from biopsy and her right arm is still sore, but overall she is feeling better    Pertinent History Patient was diagnosed with right lobular cancer with 3 masses located at 12:00, 9:00, and 10:00.  The 10:00 mass has not be yet biopsied but the others are ER/PR positive and HER2 negative with a Ki67 of 20%.  The left side showed a 31m finding and she is going to have that biopsied.   She began having right upper trap and scapular pain 1-2 years ago but that has worsened over the past several months due to the  stress of her husband's illness and now her own diagnosis.   Patient Stated Goals Reduce lymphedema risk and learn post op shoulder ROM HEP; reduce right upper trap pain   Currently in Pain? Yes   Pain Score --  did not rate                         [facility] Adult PT Treatment/Exercise - 10/07/15 0001    Shoulder Exercises: Supine   Protraction Strengthening;Both;10 reps;Weights   Protraction Weight (lbs) 2   Other Supine Exercises supine scapular series with green theraband x 10 reps    Shoulder Exercises: Prone   Other Prone Exercises from plank on knees and elbow, scapular protraction with return to neurtral only    Shoulder Exercises: Sidelying   External Rotation AAROM;Right   External Rotation Limitations scapular winging noted , so not ready to add weight yet    Moist Heat Therapy   Number Minutes Moist Heat 20 Minutes   Moist Heat Location Other (comment)  Bil upper back and neck    Electrical Stimulation   Electrical Stimulation Location Bil cervical muscles/medial scapular borders   Electrical Stimulation Action IFC   Electrical Stimulation Parameters intensity at  7    Electrical Stimulation Goals Pain   Manual Therapy   Soft tissue mobilization In Lt S/L: To Rt rhomboid/upper trap area with trigger point release to medial scapular border (performed this last session but forgot to document)   Scapular Mobilization inferior glide to right scapula in left sidelying                 PT Education - 10/07/15 1253    Education provided Yes   Education Details supine scapular series  with green theraband   Person(s) Educated Patient   Methods Explanation;Handout   Comprehension Verbalized understanding;Returned demonstration           Short Term Clinic Goals - 10/05/15 1022    CC Short Term Goal  #1   Title Patient will be able to perform initial home exercise program correctly and safely   Status On-going   CC Short Term Goal  #2   Title  Patient will be able to report right upper trap pain decreased >/= 25% to tolerate daily tasks with greater ease   Status On-going   CC Short Term Goal  #3   Title Patient will be able to report she is able to use her right arm with >/= 25% less difficulty   CC Short Term Goal  #4   Title Patient will be able to report >/= 25% less numbness intensity and incidence in her right arm during the night.   Status On-going           Breast Clinic Goals - 09/28/15 1320    Patient will be able to verbalize understanding of pertinent lymphedema risk reduction practices relevant to her diagnosis specifically related to skin care.   Time 1   Period Days   Status Achieved   Patient will be able to return demonstrate and/or verbalize understanding of the post-op home exercise program related to regaining shoulder range of motion.   Time 1   Period Days   Status Achieved   Patient will be able to verbalize understanding of the importance of attending the postoperative After Breast Cancer Class for further lymphedema risk reduction education and therapeutic exercise.   Time 1   Period Days   Status Achieved          Long Term Clinic Goals - 09/28/15 1330    CC Long Term Goal  #1   Title Patient will be able to report right upper trap pain decreased >/= 50% to tolerate daily tasks with greater ease   Time 8   Period Weeks   Status New   CC Long Term Goal  #2   Title Patient will be able to report she is able to use her right arm with >/= 50% less difficulty   Time 8   Period Weeks   Status New   CC Long Term Goal  #3   Title Patient will be able to report >/= 25% less numbness intensity and incidence in her right arm during the night.   Time 8   Period Weeks   Status New            Plan - 10/07/15 1254    Clinical Impression Statement treatment today focused on exercise for scapular strengthening. she was able to perform well, but some right scapular instability noted with right  UE external rotation in sidelying.  She will benefit from continued progressive scapular strengthening,   Clinical Impairments Affecting Rehab Potential Active breast cancer   PT  Next Visit Plan  Progress scapular strengthening to [place] if she can stablaize scapula throught out. and alos standing theraband loop wall walking, elbows back and forearm slides. Cont soft tissue work to right upper trap and rhomboid; passive right UE neural stretching; postural exercises and cont modalities   Consulted and Agree with Plan of Care Patient        Problem List Patient Active Problem List   Diagnosis Date Noted  . Genetic testing 10/06/2015  . Breast cancer of upper-outer quadrant of right female breast ([place]) 09/23/2015  . Grave's disease   Donato Heinz. Owens Shark, PT 10/07/2015, 12:59 PM  [place], [place], [address] Phone: [phone]   Fax:  [phone]

## 2015-10-07 NOTE — Patient Instructions (Addendum)
Over Head Pull: Narrow Grip        On back, knees bent, feet flat, band across thighs, elbows straight but relaxed. Pull hands apart (start). Keeping elbows straight, bring arms up and over head, hands toward floor. Keep pull steady on band. Hold momentarily. Return slowly, keeping pull steady, back to start. Repeat _10__ times. Band color _red_____   Do also with wide grip  10 reps with red theraband   Side Pull: Double Arm   On back, knees bent, feet flat. Arms perpendicular to body, shoulder level, elbows straight but relaxed. Pull arms out to sides, elbows straight. Resistance band comes across collarbones, hands toward floor. Hold momentarily. Slowly return to starting position. Repeat _10__ times. Band color  Red _____   Sash   On back, knees bent, feet flat, left hand on left hip, right hand above left. Pull right arm DIAGONALLY (hip to shoulder) across chest. Bring right arm along head toward floor. Hold momentarily. Slowly return to starting position. Repeat _10__ times. Do with left arm. Band color ___red___   Shoulder Rotation: Double Arm   On back, knees bent, feet flat, elbows tucked at sides, bent 90, hands palms up. Pull hands apart and down toward floor, keeping elbows near sides. Hold momentarily. Slowly return to starting position. Repeat __10_ times. Band color ___red___            Copyright  VHI. All rights reserved.

## 2015-10-11 ENCOUNTER — Ambulatory Visit: Payer: BLUE CROSS/BLUE SHIELD

## 2015-10-12 ENCOUNTER — Ambulatory Visit: Payer: BLUE CROSS/BLUE SHIELD

## 2015-10-12 DIAGNOSIS — R200 Anesthesia of skin: Secondary | ICD-10-CM

## 2015-10-12 DIAGNOSIS — M5382 Other specified dorsopathies, cervical region: Secondary | ICD-10-CM

## 2015-10-12 DIAGNOSIS — M629 Disorder of muscle, unspecified: Secondary | ICD-10-CM

## 2015-10-12 DIAGNOSIS — R293 Abnormal posture: Secondary | ICD-10-CM

## 2015-10-12 DIAGNOSIS — C50911 Malignant neoplasm of unspecified site of right female breast: Secondary | ICD-10-CM

## 2015-10-12 NOTE — Therapy (Signed)
[place], [place], [address] Phone: [phone]   Fax:  [phone]  Physical Therapy Treatment  Patient Details  Name: Amber Pugh MRN: [id number] Date of Birth: [birth year] Referring Provider:  Rolm Bookbinder, MD  Encounter Date: 10/12/2015      PT End of Session - 10/12/15 1205    Visit Number 5   Number of Visits 16   Date for PT Re-Evaluation 11/23/15   PT Start Time 1114   PT Stop Time 1200   PT Time Calculation (min) 46 min   Activity Tolerance Patient tolerated treatment well   Behavior During Therapy [facility] for tasks assessed/performed      Past Medical History  Diagnosis Date  . Grave's disease     GRAVES DISEASE  . Migraines     without aura  . Abnormal Pap smear of cervix 6/02    +HPV HR, CIN1  . MVP (mitral valve prolapse)     neg cardio workup  . Melasma   . Basal cell carcinoma   . Breast cancer   . Graves disease   . Joint pain   . Graves disease 1999    she was treatmented with medications 3 times, in remission for the past 3 years ago    Past Surgical History  Procedure Laterality Date  . Cryotherapy  3/01    CIN1    There were no vitals filed for this visit.  Visit Diagnosis:  Musculoskeletal disorder involving upper trapezius muscle  Abnormal posture  Right arm numbness  Breast cancer, female, right      Subjective Assessment - 10/12/15 1118    Subjective Like the exercises I was given last time, I've been doing them. My shoulders feel really tight today though. I'm going for a massage on Friday. My Rt scapula medial border was really tight and painful yesterday.    Currently in Pain? No/denies                         [facility] Adult PT Treatment/Exercise - 10/12/15 0001    Manual Therapy   Soft tissue mobilization In Lt S/L: To Rt rhomboid/upper trap area with trigger point release to medial scapular border. Also to bil upper  traps in supine where pt c/o tightness   Scapular Mobilization inferior glide to right scapula in left sidelying    Passive ROM To Rt shoulder into flexion, abduction and D2 and neural tension stretch all to pts tolerance and very gently.                   Short Term Clinic Goals - 10/05/15 1022    CC Short Term Goal  #1   Title Patient will be able to perform initial home exercise program correctly and safely   Status On-going   CC Short Term Goal  #2   Title Patient will be able to report right upper trap pain decreased >/= 25% to tolerate daily tasks with greater ease   Status On-going   CC Short Term Goal  #3   Title Patient will be able to report she is able to use her right arm with >/= 25% less difficulty   CC Short Term Goal  #4   Title Patient will be able to report >/= 25% less numbness intensity and incidence in her right arm during the night.   Status On-going  Breast Clinic Goals - 09/28/15 1320    Patient will be able to verbalize understanding of pertinent lymphedema risk reduction practices relevant to her diagnosis specifically related to skin care.   Time 1   Period Days   Status Achieved   Patient will be able to return demonstrate and/or verbalize understanding of the post-op home exercise program related to regaining shoulder range of motion.   Time 1   Period Days   Status Achieved   Patient will be able to verbalize understanding of the importance of attending the postoperative After Breast Cancer Class for further lymphedema risk reduction education and therapeutic exercise.   Time 1   Period Days   Status Achieved          Long Term Clinic Goals - 09/28/15 1330    CC Long Term Goal  #1   Title Patient will be able to report right upper trap pain decreased >/= 50% to tolerate daily tasks with greater ease   Time 8   Period Weeks   Status New   CC Long Term Goal  #2   Title Patient will be able to report she is able to use  her right arm with >/= 50% less difficulty   Time 8   Period Weeks   Status New   CC Long Term Goal  #3   Title Patient will be able to report >/= 25% less numbness intensity and incidence in her right arm during the night.   Time 8   Period Weeks   Status New            Plan - 10/12/15 1205    Clinical Impression Statement Pt came in reporting that her Rt scapular medial border was sore today but had been painful yesterdayand wanted to work on that today. She has been doing the new scapular exercises at home and reports these beneficial. Pt reported feeling better today after treatment.    Pt will benefit from skilled therapeutic intervention in order to improve on the following deficits Decreased range of motion;Increased fascial restricitons;Pain;Impaired UE functional use;Decreased knowledge of precautions;Decreased strength;Increased muscle spasms   Rehab Potential Excellent   Clinical Impairments Affecting Rehab Potential Active breast cancer   PT Frequency 2x / week   PT Duration 8 weeks   PT Treatment/Interventions ADLs/Self Care Home Management;Traction;Passive range of motion;Patient/family education;Electrical Stimulation;Therapeutic exercise;Manual techniques;Moist Heat   PT Next Visit Plan  Assess next visit. Progress scapular strengthening to [place] if she can stablaize scapula throught out. and alos standing theraband loop wall walking, elbows back and forearm slides. Cont soft tissue work to right upper trap and rhomboid; passive right UE neural stretching; postural exercises and cont modalities   Recommended Other Services Pt is looking into getting a generic TENS type unit. Will possibly look into this today.    Consulted and Agree with Plan of Care Patient        Problem List Patient Active Problem List   Diagnosis Date Noted  . Genetic testing 10/06/2015  . Breast cancer of upper-outer quadrant of right female breast ([place]) 09/23/2015  . Grave's disease      Otelia Limes, PTA 10/12/2015, 12:10 PM  [place], [place], [address] Phone: [phone]   Fax:  [phone]

## 2015-10-13 ENCOUNTER — Ambulatory Visit: Payer: BLUE CROSS/BLUE SHIELD

## 2015-10-13 DIAGNOSIS — M629 Disorder of muscle, unspecified: Secondary | ICD-10-CM

## 2015-10-13 DIAGNOSIS — M5382 Other specified dorsopathies, cervical region: Secondary | ICD-10-CM

## 2015-10-13 DIAGNOSIS — R200 Anesthesia of skin: Secondary | ICD-10-CM

## 2015-10-13 DIAGNOSIS — C50911 Malignant neoplasm of unspecified site of right female breast: Secondary | ICD-10-CM

## 2015-10-13 DIAGNOSIS — R293 Abnormal posture: Secondary | ICD-10-CM

## 2015-10-13 NOTE — Patient Instructions (Signed)
Stabilize scapula before each motion!!! Strengthening: Resisted Internal Rotation   Hold tubing in left hand, elbow at side and forearm out. Rotate forearm in across body. Repeat __10__ times per set. Do __2__ sets per session. Do __2__ sessions per day.  http://orth.exer.us/830   Copyright  VHI. All rights reserved.  Strengthening: Resisted External Rotation   Hold tubing in right hand, elbow at side and forearm across body. Rotate forearm out. Repeat _10___ times per set. Do _2___ sets per session. Do _2___ sessions per day.  http://orth.exer.us/828   Copyright  VHI. All rights reserved.  Strengthening: Resisted Flexion   Hold tubing with left arm at side. Pull forward and up. Move shoulder through pain-free range of motion. Repeat _10___ times per set. Do _2___ sets per session. Do __2__ sessions per day.  http://orth.exer.us/824   Copyright  VHI. All rights reserved.  Strengthening: Resisted Extension   Hold tubing in right hand, arm forward. Pull arm back, elbow straight. Repeat _10___ times per set. Do _2___ sets per session. Do _2___ sessions per day.  http://orth.exer.us/832   Copyright  VHI. All rights reserved.

## 2015-10-13 NOTE — Therapy (Signed)
[place], [place], [address] Phone: [phone]   Fax:  [phone]  Physical Therapy Treatment  Patient Details  Name: Amber Pugh MRN: [id number] Date of Birth: [birth year] Referring Provider:  Rolm Bookbinder, MD  Encounter Date: 10/13/2015      PT End of Session - 10/13/15 1004    Visit Number 6   Number of Visits 16   Date for PT Re-Evaluation 11/23/15   PT Start Time 0939   PT Stop Time 1033   PT Time Calculation (min) 54 min   Activity Tolerance Patient tolerated treatment well   Behavior During Therapy [facility] for tasks assessed/performed      Past Medical History  Diagnosis Date  . Grave's disease     GRAVES DISEASE  . Migraines     without aura  . Abnormal Pap smear of cervix 6/02    +HPV HR, CIN1  . MVP (mitral valve prolapse)     neg cardio workup  . Melasma   . Basal cell carcinoma   . Breast cancer   . Graves disease   . Joint pain   . Graves disease 1999    she was treatmented with medications 3 times, in remission for the past 3 years ago    Past Surgical History  Procedure Laterality Date  . Cryotherapy  3/01    CIN1    There were no vitals filed for this visit.  Visit Diagnosis:  Musculoskeletal disorder involving upper trapezius muscle  Abnormal posture  Right arm numbness  Breast cancer, female, right      Subjective Assessment - 10/13/15 0945    Subjective Things have been really stressful at home with deciding if my husband is going to go out on FMLA soon due to his reactions to his current chemo treatments. My Rt upper traps and scapular muscles were really tight yesterday.    Currently in Pain? No/denies                         [facility] Adult PT Treatment/Exercise - 10/13/15 0001    Shoulder Exercises: Standing   External Rotation Strengthening;Right;10 reps;Theraband   Theraband Level (Shoulder External Rotation) Level 2  (Red)   Internal Rotation Strengthening;Right;10 reps;Theraband   Theraband Level (Shoulder Internal Rotation) Level 2 (Red)   Flexion Strengthening;Right;10 reps;Theraband   Theraband Level (Shoulder Flexion) Level 2 (Red)   Extension Strengthening;Right;10 reps;Theraband   Theraband Level (Shoulder Extension) Level 2 (Red)   Extension Limitations Needed to do with elbow extended due to sharp pains in posterior upper arm with elbow flexed   Moist Heat Therapy   Number Minutes Moist Heat 15 Minutes   Moist Heat Location Other (comment)  Bil upper back/neck   Electrical Stimulation   Electrical Stimulation Location Bil cervical muscles/medial scapular borders   Electrical Stimulation Action IFC   Electrical Stimulation Parameters 80-150Hz  x 15 minutes   Electrical Stimulation Goals Pain   Manual Therapy   Soft tissue mobilization In Lt S/L: To Rt rhomboid/upper trap area with trigger point release to medial scapular border. Also to bil upper traps in supine where pt c/o tightness   Scapular Mobilization inferior glide to right scapula in left sidelying                 PT Education - 10/13/15 0956    Education provided Yes   Education Details [place] for Rt UE   Person(s) Educated Patient  Methods Explanation;Demonstration;Handout   Comprehension Verbalized understanding;Returned demonstration           Short Term Clinic Goals - 10/13/15 1022    CC Short Term Goal  #1   Title Patient will be able to perform initial home exercise program correctly and safely   Status Achieved   CC Short Term Goal  #2   Title Patient will be able to report right upper trap pain decreased >/= 25% to tolerate daily tasks with greater ease  50% improvement reported   Status Achieved   CC Short Term Goal  #3   Title Patient will be able to report she is able to use her right arm with >/= 25% less difficulty  Pt is limiting some of the activities that were bothering her UE   Status  On-going   CC Short Term Goal  #4   Title Patient will be able to report >/= 25% less numbness intensity and incidence in her right arm during the night.  Pt reports overall 80-90% improved   Status Achieved           Breast Clinic Goals - 09/28/15 1320    Patient will be able to verbalize understanding of pertinent lymphedema risk reduction practices relevant to her diagnosis specifically related to skin care.   Time 1   Period Days   Status Achieved   Patient will be able to return demonstrate and/or verbalize understanding of the post-op home exercise program related to regaining shoulder range of motion.   Time 1   Period Days   Status Achieved   Patient will be able to verbalize understanding of the importance of attending the postoperative After Breast Cancer Class for further lymphedema risk reduction education and therapeutic exercise.   Time 1   Period Days   Status Achieved          Long Term Clinic Goals - 09/28/15 1330    CC Long Term Goal  #1   Title Patient will be able to report right upper trap pain decreased >/= 50% to tolerate daily tasks with greater ease   Time 8   Period Weeks   Status New   CC Long Term Goal  #2   Title Patient will be able to report she is able to use her right arm with >/= 50% less difficulty   Time 8   Period Weeks   Status New   CC Long Term Goal  #3   Title Patient will be able to report >/= 25% less numbness intensity and incidence in her right arm during the night.   Time 8   Period Weeks   Status New            Plan - 10/13/15 1019    Clinical Impression Statement Pt reported today that she did feel better after last treatment, had some increase in tightness yesterday in scapular area but not as much as Monday. Today, though she had some palpable tightness in bil upper traps it was less than it had been Monday. Progressed pts HEP to include Rt shoulder strengthening with scapular stabilization througout.    Pt will  benefit from skilled therapeutic intervention in order to improve on the following deficits Decreased range of motion;Increased fascial restricitons;Pain;Impaired UE functional use;Decreased knowledge of precautions;Decreased strength;Increased muscle spasms   Rehab Potential Excellent   Clinical Impairments Affecting Rehab Potential Active breast cancer   PT Frequency 2x / week   PT Duration 8 weeks  PT Treatment/Interventions ADLs/Self Care Home Management;Traction;Passive range of motion;Patient/family education;Electrical Stimulation;Therapeutic exercise;Manual techniques;Moist Heat   PT Next Visit Plan  Assess next visit. Progress scapular strengthening to [place] if she can stablaize scapula throught out. and alos standing theraband loop wall walking, elbows back and forearm slides. Cont soft tissue work to right upper trap and rhomboid; passive right UE neural stretching; postural exercises and cont modalities   Consulted and Agree with Plan of Care Patient        Problem List Patient Active Problem List   Diagnosis Date Noted  . Genetic testing 10/06/2015  . Breast cancer of upper-outer quadrant of right female breast ([place]) 09/23/2015  . Grave's disease     Otelia Limes, PTA 10/13/2015, 10:36 AM  [place], [place], [address] Phone: [phone]   Fax:  [phone]

## 2015-10-14 ENCOUNTER — Encounter: Payer: Self-pay | Admitting: *Deleted

## 2015-10-14 NOTE — Progress Notes (Signed)
Amber Pugh  [employer] Social Pugh  Clinical Social Pugh met with pt for counseling session for ongoing processing of diagnosis, adjustment to illness. Supportive listening provided. Pt doing very well. Pt agrees to reach out as needed.    Clinical Social Pugh interventions: Counseling session  Loren Racer, [place] Worker [place]  [place] Phone: [phone] Fax: [phone]

## 2015-10-18 ENCOUNTER — Ambulatory Visit: Payer: BLUE CROSS/BLUE SHIELD

## 2015-10-18 DIAGNOSIS — M5382 Other specified dorsopathies, cervical region: Secondary | ICD-10-CM | POA: Diagnosis not present

## 2015-10-18 DIAGNOSIS — M629 Disorder of muscle, unspecified: Secondary | ICD-10-CM

## 2015-10-18 DIAGNOSIS — R293 Abnormal posture: Secondary | ICD-10-CM

## 2015-10-18 NOTE — Therapy (Signed)
[place], [place], [address] Phone: [phone]   Fax:  [phone]  Physical Therapy Treatment  Patient Details  Name: Amber Pugh MRN: [id number] Date of Birth: November 23, 1971 No Data Recorded  Encounter Date: 10/18/2015      PT End of Session - 10/18/15 1021    Visit Number 7   Number of Visits 16   Date for PT Re-Evaluation 11/23/15   PT Start Time 0939  Pt running late today   PT Stop Time 1034   PT Time Calculation (min) 55 min   Activity Tolerance Patient tolerated treatment well   Behavior During Therapy [facility] for tasks assessed/performed      Past Medical History  Diagnosis Date  . Grave's disease     GRAVES DISEASE  . Migraines     without aura  . Abnormal Pap smear of cervix 6/02    +HPV HR, CIN1  . MVP (mitral valve prolapse)     neg cardio workup  . Melasma   . Basal cell carcinoma   . Breast cancer   . Graves disease   . Joint pain   . Graves disease 1999    she was treatmented with medications 3 times, in remission for the past 3 years ago    Past Surgical History  Procedure Laterality Date  . Cryotherapy  3/01    CIN1    There were no vitals filed for this visit.  Visit Diagnosis:  Musculoskeletal disorder involving upper trapezius muscle  Abnormal posture      Subjective Assessment - 10/18/15 0944    Subjective I've had 2 massages since my last visit, had one Friday and then yesterday and she's been focusing on my shoulders and upper traps area. I feel great after but my muscle memory is so strong I feel like I just draw right back up. My Rt shoulder girdle is just sore today, not pain.                         [facility] Adult PT Treatment/Exercise - 10/18/15 0001    Shoulder Exercises: Standing   Horizontal ABduction Strengthening;Both;10 reps;Theraband   Theraband Level (Shoulder Horizontal ABduction) Level 2 (Red)   External Rotation  Strengthening;Both;10 reps;Theraband   Theraband Level (Shoulder External Rotation) Level 2 (Red)   Flexion Strengthening;Both;10 reps;Theraband  Narrow and grip   Theraband Level (Shoulder Flexion) Level 2 (Red)   Other Standing Exercises Bil D2 with red theraband 10 reps each   Moist Heat Therapy   Number Minutes Moist Heat 15 Minutes   Moist Heat Location Other (comment)  Bil upper back/neck   Electrical Stimulation   Electrical Stimulation Location Bil cervical muscles/medial scapular borders   Electrical Stimulation Action IFC   Electrical Stimulation Parameters 80-150 Hz    Electrical Stimulation Goals Pain   Manual Therapy   Soft tissue mobilization In Supine: Focused on Lt upper traps trigger point release with cervical stretching into Rt side bend to increase trigger point release                   Short Term Clinic Goals - 10/13/15 1022    CC Short Term Goal  #1   Title Patient will be able to perform initial home exercise program correctly and safely   Status Achieved   CC Short Term Goal  #2   Title Patient will be able to report right upper trap pain  decreased >/= 25% to tolerate daily tasks with greater ease  50% improvement reported   Status Achieved   CC Short Term Goal  #3   Title Patient will be able to report she is able to use her right arm with >/= 25% less difficulty  Pt is limiting some of the activities that were bothering her UE   Status On-going   CC Short Term Goal  #4   Title Patient will be able to report >/= 25% less numbness intensity and incidence in her right arm during the night.  Pt reports overall 80-90% improved   Status Achieved           Breast Clinic Goals - 09/28/15 1320    Patient will be able to verbalize understanding of pertinent lymphedema risk reduction practices relevant to her diagnosis specifically related to skin care.   Time 1   Period Days   Status Achieved   Patient will be able to return demonstrate  and/or verbalize understanding of the post-op home exercise program related to regaining shoulder range of motion.   Time 1   Period Days   Status Achieved   Patient will be able to verbalize understanding of the importance of attending the postoperative After Breast Cancer Class for further lymphedema risk reduction education and therapeutic exercise.   Time 1   Period Days   Status Achieved          Long Term Clinic Goals - 09/28/15 1330    CC Long Term Goal  #1   Title Patient will be able to report right upper trap pain decreased >/= 50% to tolerate daily tasks with greater ease   Time 8   Period Weeks   Status New   CC Long Term Goal  #2   Title Patient will be able to report she is able to use her right arm with >/= 50% less difficulty   Time 8   Period Weeks   Status New   CC Long Term Goal  #3   Title Patient will be able to report >/= 25% less numbness intensity and incidence in her right arm during the night.   Time 8   Period Weeks   Status New            Plan - 10/18/15 1021    Clinical Impression Statement Pt doing well with scapular strengthening and improvement is noted in soft tissue work as far as being palpably softer. She reports having some temporary relief after her therapy sessions.    Pt will benefit from skilled therapeutic intervention in order to improve on the following deficits Decreased range of motion;Increased fascial restricitons;Pain;Impaired UE functional use;Decreased knowledge of precautions;Decreased strength;Increased muscle spasms   Rehab Potential Excellent   Clinical Impairments Affecting Rehab Potential Active breast cancer   PT Frequency 2x / week   PT Duration 8 weeks   PT Treatment/Interventions ADLs/Self Care Home Management;Traction;Passive range of motion;Patient/family education;Electrical Stimulation;Therapeutic exercise;Manual techniques;Moist Heat   PT Next Visit Plan  Assess next visit. Progress scapular strengthening to  standing theraband loop wall walking, elbows back and forearm slides. Cont soft tissue work to right upper trap and rhomboid; passive right UE neural stretching; postural exercises and cont modalities   Consulted and Agree with Plan of Care Patient        Problem List Patient Active Problem List   Diagnosis Date Noted  . Genetic testing 10/06/2015  . Breast cancer of upper-outer quadrant of right female breast (  [place]) 09/23/2015  . Grave's disease     Otelia Limes, [place] 10/18/2015, 11:12 AM  [place], [place], [address] Phone: [phone]   Fax:  [phone]  Name: Amber Pugh MRN: [id number] Date of Birth: [birth year]

## 2015-10-20 ENCOUNTER — Encounter: Payer: Self-pay | Admitting: *Deleted

## 2015-10-20 ENCOUNTER — Other Ambulatory Visit: Payer: Self-pay | Admitting: General Surgery

## 2015-10-20 ENCOUNTER — Ambulatory Visit: Payer: BLUE CROSS/BLUE SHIELD

## 2015-10-20 DIAGNOSIS — M5382 Other specified dorsopathies, cervical region: Secondary | ICD-10-CM | POA: Diagnosis not present

## 2015-10-20 DIAGNOSIS — C50911 Malignant neoplasm of unspecified site of right female breast: Secondary | ICD-10-CM

## 2015-10-20 DIAGNOSIS — M629 Disorder of muscle, unspecified: Secondary | ICD-10-CM

## 2015-10-20 DIAGNOSIS — R200 Anesthesia of skin: Secondary | ICD-10-CM

## 2015-10-20 DIAGNOSIS — C50411 Malignant neoplasm of upper-outer quadrant of right female breast: Secondary | ICD-10-CM

## 2015-10-20 DIAGNOSIS — R293 Abnormal posture: Secondary | ICD-10-CM

## 2015-10-20 NOTE — Progress Notes (Signed)
[place] Work  Clinical Social Work was referred by pt who had questions re ADRs while in surgery and if she had to make changes as her HCPOA. CSW reviewed ADRs and pt has second POA listed, so no changes needed. CSW educated pt on this as well. Pt doing well and had a great breast cancer support group this week. CSW to follow and be available as needed.   Clinical Social Work interventions: ADR education Supportive listening  Loren Racer, [place]  [place] Phone: [phone] Fax: [phone]

## 2015-10-20 NOTE — Therapy (Signed)
[place], [place], [address] Phone: [phone]   Fax:  [phone]  Physical Therapy Treatment  Patient Details  Name: Amber Pugh MRN: [id number] Date of Birth: 29-Dec-1971 No Data Recorded  Encounter Date: 10/20/2015      PT End of Session - 10/20/15 1239    Visit Number 8   Number of Visits 16   Date for PT Re-Evaluation 11/23/15   PT Start Time 1156  Pt arrived late   PT Stop Time 1234   PT Time Calculation (min) 38 min   Activity Tolerance Patient tolerated treatment well   Behavior During Therapy [facility] for tasks assessed/performed      Past Medical History  Diagnosis Date  . Grave's disease     GRAVES DISEASE  . Migraines     without aura  . Abnormal Pap smear of cervix 6/02    +HPV HR, CIN1  . MVP (mitral valve prolapse)     neg cardio workup  . Melasma   . Basal cell carcinoma   . Breast cancer   . Graves disease   . Joint pain   . Graves disease 1999    she was treatmented with medications 3 times, in remission for the past 3 years ago    Past Surgical History  Procedure Laterality Date  . Cryotherapy  3/01    CIN1    There were no vitals filed for this visit.  Visit Diagnosis:  Musculoskeletal disorder involving upper trapezius muscle  Abnormal posture  Right arm numbness  Breast cancer, female, right      Subjective Assessment - 10/20/15 1200    Subjective Feel okay today. My husband has a chemo treatment today and I have a few doctor appointments today so I've been a little stressed increasing my muscle tension.    Currently in Pain? No/denies                         [facility] Adult PT Treatment/Exercise - 10/20/15 0001    Shoulder Exercises: Standing   Other Standing Exercises Green theraband looped at forearms for: scapular retraction with contralateral hand against wall, 10 reps each, then forearm slides 5 reps, and forearm wall  walks 5 reps each with demo and cuing for correct technique.   Manual Therapy   Soft tissue mobilization Seated in chair for soft-deep tissue work and trigger point release to medial border of Rt scapula and Rt>Lt upper traps, also did PROM of Rt UE into horizontal adduction to increase trigger point release   Scapular Mobilization Seated in chair for Rt scapula protraction and retraction with Rt UEPROM into horozintal ab/adduction.                PT Education - 10/20/15 1237    Education provided Yes   Education Details Instructed pt to try placing a tennis ball in a long sock for self triger point release by leaning against a wall.   Person(s) Educated Patient   Methods Explanation   Comprehension Verbalized understanding           Short Term Clinic Goals - 10/13/15 1022    CC Short Term Goal  #1   Title Patient will be able to perform initial home exercise program correctly and safely   Status Achieved   CC Short Term Goal  #2   Title Patient will be able to report right upper trap pain decreased >/= 25%  to tolerate daily tasks with greater ease  50% improvement reported   Status Achieved   CC Short Term Goal  #3   Title Patient will be able to report she is able to use her right arm with >/= 25% less difficulty  Pt is limiting some of the activities that were bothering her UE   Status On-going   CC Short Term Goal  #4   Title Patient will be able to report >/= 25% less numbness intensity and incidence in her right arm during the night.  Pt reports overall 80-90% improved   Status Achieved           Long Term Clinic Goals - 10/20/15 1242    CC Long Term Goal  #1   Title Patient will be able to report right upper trap pain decreased >/= 50% to tolerate daily tasks with greater ease  Pt has temporary relief after session but due to stress with her and her husbands health issues the tightness keeps returning.   Status On-going   CC Long Term Goal  #2   Title  Patient will be able to report she is able to use her right arm with >/= 50% less difficulty   Status On-going   CC Long Term Goal  #3   Title Patient will be able to report >/= 25% less numbness intensity and incidence in her right arm during the night.   Status On-going            Plan - 10/20/15 1239    Clinical Impression Statement Added some scapular stabilization exercises to treatment today and pt reported really feeling these working in her scapular and upper trap muscles. Her soft tisse was less tight after manual therapy today and she reported improvement noted in tenderness/tightness.    Pt will benefit from skilled therapeutic intervention in order to improve on the following deficits Decreased range of motion;Increased fascial restricitons;Pain;Impaired UE functional use;Decreased knowledge of precautions;Decreased strength;Increased muscle spasms   Rehab Potential Excellent   Clinical Impairments Affecting Rehab Potential Active breast cancer   PT Frequency 2x / week   PT Duration 8 weeks   PT Treatment/Interventions ADLs/Self Care Home Management;Traction;Passive range of motion;Patient/family education;Electrical Stimulation;Therapeutic exercise;Manual techniques;Moist Heat        Problem List Patient Active Problem List   Diagnosis Date Noted  . Genetic testing 10/06/2015  . Breast cancer of upper-outer quadrant of right female breast ([place]) 09/23/2015  . Grave's disease     Otelia Limes, [place] 10/20/2015, 12:44 PM  [place], [place], [address] Phone: [phone]   Fax:  [phone]  Name: Amber Pugh MRN: [id number] Date of Birth: 1971/01/09

## 2015-10-21 ENCOUNTER — Ambulatory Visit: Payer: BLUE CROSS/BLUE SHIELD | Admitting: Physical Therapy

## 2015-10-25 ENCOUNTER — Ambulatory Visit: Payer: BLUE CROSS/BLUE SHIELD

## 2015-10-25 DIAGNOSIS — M5382 Other specified dorsopathies, cervical region: Secondary | ICD-10-CM | POA: Diagnosis not present

## 2015-10-25 DIAGNOSIS — M629 Disorder of muscle, unspecified: Secondary | ICD-10-CM

## 2015-10-25 DIAGNOSIS — C50911 Malignant neoplasm of unspecified site of right female breast: Secondary | ICD-10-CM

## 2015-10-25 DIAGNOSIS — R293 Abnormal posture: Secondary | ICD-10-CM

## 2015-10-25 NOTE — Therapy (Signed)
[place], [place], [address] Phone: [phone]   Fax:  [phone]  Physical Therapy Treatment  Patient Details  Name: Amber Pugh MRN: [id number] Date of Birth: Jul 24, 1971 No Data Recorded  Encounter Date: 10/25/2015      PT End of Session - 10/25/15 1122    Visit Number 9   Number of Visits 16   Date for PT Re-Evaluation 11/23/15   PT Start Time 0854  Pt arrived late   PT Stop Time 0951   PT Time Calculation (min) 57 min   Activity Tolerance Patient tolerated treatment well   Behavior During Therapy [facility] for tasks assessed/performed      Past Medical History  Diagnosis Date  . Grave's disease     GRAVES DISEASE  . Migraines     without aura  . Abnormal Pap smear of cervix 6/02    +HPV HR, CIN1  . MVP (mitral valve prolapse)     neg cardio workup  . Melasma   . Basal cell carcinoma   . Breast cancer   . Graves disease   . Joint pain   . Graves disease 1999    she was treatmented with medications 3 times, in remission for the past 3 years ago    Past Surgical History  Procedure Laterality Date  . Cryotherapy  3/01    CIN1    There were no vitals filed for this visit.  Visit Diagnosis:  Musculoskeletal disorder involving upper trapezius muscle  Abnormal posture  Breast cancer, female, right      Subjective Assessment - 10/25/15 0855    Subjective Feeling good, had another massage this week and she said she could tell my muscles are looser. I am going to have my bil masectomy next Tuesday. I've been doing my exercises and those are helping as well.                         [facility] PT Treatment/Exercise - 10/25/15 0001    Moist Heat Therapy   Number Minutes Moist Heat 15 Minutes   Moist Heat Location Cervical   Electrical Stimulation   Electrical Stimulation Location Bil cervical muscles/medial scapular borders   Electrical Stimulation  Action IFC   Electrical Stimulation Parameters 80-150 Hz x 15 minutes   Electrical Stimulation Goals Pain   Manual Therapy   Soft tissue mobilization Seated in chair for soft-deep tissue work  Rt>Lt upper traps and medial scapula border areas                PT Education - 10/25/15 1120    Education provided Yes   Education Details Answered pts questions regarding what she should focus on before her surgery next week and then what is safe to do after, focusing on erect posture before and scapular strength and then, with okay from MD, to perform stretches in packet from [facility], PT that pt was issued at breast clinic and answered pts questions regarding those.    Person(s) Educated Patient   Methods Explanation;Demonstration   Comprehension Verbalized understanding           Short Term Clinic Goals - 10/13/15 1022    CC Short Term Goal  #1   Title Patient will be able to perform initial home exercise program correctly and safely   Status Achieved   CC Short Term Goal  #2   Title Patient will be able to  report right upper trap pain decreased >/= 25% to tolerate daily tasks with greater ease  50% improvement reported   Status Achieved   CC Short Term Goal  #3   Title Patient will be able to report she is able to use her right arm with >/= 25% less difficulty  Pt is limiting some of the activities that were bothering her UE   Status On-going   CC Short Term Goal  #4   Title Patient will be able to report >/= 25% less numbness intensity and incidence in her right arm during the night.  Pt reports overall 80-90% improved   Status Achieved            Long Term Clinic Goals - 10/20/15 1242    CC Long Term Goal  #1   Title Patient will be able to report right upper trap pain decreased >/= 50% to tolerate daily tasks with greater ease  Pt has temporary relief after session but due to stress with her and her husbands health issues the tightness keeps returning.   Status  On-going   CC Long Term Goal  #2   Title Patient will be able to report she is able to use her right arm with >/= 50% less difficulty   Status On-going   CC Long Term Goal  #3   Title Patient will be able to report >/= 25% less numbness intensity and incidence in her right arm during the night.   Status On-going            Plan - 10/25/15 1123    Clinical Impression Statement Pt is making good progress with carryover of postural corrections and she reports noticing her scapular strength has improved and doesn;t find herself working as hard to keep her posture erect during day and this is noticeable by therapist when she is in clinic. Her deep trigger points are also less palpable than before. Pt is to have her bil masectomy surgery next Tuesday so she will cancel some of her upcoming appts until MD okays her to come back prn.   Rehab Potential Excellent   Clinical Impairments Affecting Rehab Potential Active breast cancer, bil masectomy surgery planned for 11/01/15.   PT Frequency 2x / week   PT Duration 8 weeks   PT Treatment/Interventions ADLs/Self Care Home Management;Traction;Passive range of motion;Patient/family education;Electrical Stimulation;Therapeutic exercise;Manual techniques;Moist Heat   PT Next Visit Plan Assess next visit and check goals for pt to be put on hold until after suregery next week until MD clears her to come back if needed.    Consulted and Agree with Plan of Care Patient        Problem List Patient Active Problem List   Diagnosis Date Noted  . Genetic testing 10/06/2015  . Breast cancer of upper-outer quadrant of right female breast ([place]) 09/23/2015  . Grave's disease     Otelia Limes, [place] 10/25/2015, 11:28 AM  [place], [place], [address] Phone: [phone]   Fax:  [phone]  Name: Amber Pugh MRN: [id number] Date of Birth:  1971/10/26

## 2015-10-26 ENCOUNTER — Encounter (HOSPITAL_BASED_OUTPATIENT_CLINIC_OR_DEPARTMENT_OTHER): Payer: Self-pay | Admitting: *Deleted

## 2015-10-27 ENCOUNTER — Other Ambulatory Visit: Payer: Self-pay | Admitting: *Deleted

## 2015-10-27 ENCOUNTER — Encounter (HOSPITAL_BASED_OUTPATIENT_CLINIC_OR_DEPARTMENT_OTHER)
Admission: RE | Admit: 2015-10-27 | Discharge: 2015-10-27 | Disposition: A | Discharge status: Home / Self Care | Payer: BLUE CROSS/BLUE SHIELD | Source: Ambulatory Visit | Attending: General Surgery | Admitting: General Surgery

## 2015-10-27 ENCOUNTER — Telehealth: Payer: Self-pay | Admitting: Hematology

## 2015-10-27 ENCOUNTER — Ambulatory Visit: Payer: BLUE CROSS/BLUE SHIELD

## 2015-10-27 DIAGNOSIS — M629 Disorder of muscle, unspecified: Secondary | ICD-10-CM

## 2015-10-27 DIAGNOSIS — Z01818 Encounter for other preprocedural examination: Secondary | ICD-10-CM | POA: Insufficient documentation

## 2015-10-27 DIAGNOSIS — M5382 Other specified dorsopathies, cervical region: Secondary | ICD-10-CM

## 2015-10-27 DIAGNOSIS — C50911 Malignant neoplasm of unspecified site of right female breast: Secondary | ICD-10-CM | POA: Insufficient documentation

## 2015-10-27 DIAGNOSIS — R293 Abnormal posture: Secondary | ICD-10-CM

## 2015-10-27 DIAGNOSIS — R200 Anesthesia of skin: Secondary | ICD-10-CM

## 2015-10-27 LAB — CBC WITH DIFFERENTIAL/PLATELET
Basophils Absolute: 0 10*3/uL (ref 0.0–0.1)
Basophils Relative: 1 %
Eosinophils Absolute: 0.1 10*3/uL (ref 0.0–0.7)
Eosinophils Relative: 2 %
HEMATOCRIT: 45 % (ref 36.0–46.0)
HEMOGLOBIN: 14.9 g/dL (ref 12.0–15.0)
LYMPHS ABS: 2.4 10*3/uL (ref 0.7–4.0)
Lymphocytes Relative: 40 %
MCH: 31 pg (ref 26.0–34.0)
MCHC: 33.1 g/dL (ref 30.0–36.0)
MCV: 93.6 fL (ref 78.0–100.0)
MONOS PCT: 9 %
Monocytes Absolute: 0.5 10*3/uL (ref 0.1–1.0)
NEUTROS ABS: 2.9 10*3/uL (ref 1.7–7.7)
Neutrophils Relative %: 48 %
Platelets: 207 10*3/uL (ref 150–400)
RBC: 4.81 MIL/uL (ref 3.87–5.11)
RDW: 12.1 % (ref 11.5–15.5)
WBC: 6 10*3/uL (ref 4.0–10.5)

## 2015-10-27 LAB — BASIC METABOLIC PANEL
ANION GAP: 13 (ref 5–15)
BUN: 12 mg/dL (ref 6–20)
CALCIUM: 9.8 mg/dL (ref 8.9–10.3)
CO2: 27 mmol/L (ref 22–32)
CREATININE: 0.76 mg/dL (ref 0.44–1.00)
Chloride: 101 mmol/L (ref 101–111)
Glucose, Bld: 87 mg/dL (ref 65–99)
POTASSIUM: 3.6 mmol/L (ref 3.5–5.1)
SODIUM: 141 mmol/L (ref 135–145)

## 2015-10-27 NOTE — Telephone Encounter (Signed)
s.w pt and advised don NOV appt.Marland KitchenMarland KitchenMarland KitchenMarland Kitchenpt ok and aware

## 2015-10-27 NOTE — Therapy (Signed)
[place], [place], [address] Phone: [phone]   Fax:  [phone]  Physical Therapy Treatment  Patient Details  Name: Amber Pugh MRN: [id number] Date of Birth: [birth year] No Data Recorded  Encounter Date: 10/27/2015      PT End of Session - 10/27/15 1113    Visit Number 10   Number of Visits 16   Date for PT Re-Evaluation 11/23/15   PT Start Time 1029   PT Stop Time 1124   PT Time Calculation (min) 55 min   Activity Tolerance Patient tolerated treatment well   Behavior During Therapy [facility] for tasks assessed/performed      Past Medical History  Diagnosis Date  . Grave's disease     GRAVES DISEASE  . Migraines     without aura  . Abnormal Pap smear of cervix 6/02    +HPV HR, CIN1  . MVP (mitral valve prolapse)     neg cardio workup  . Melasma   . Basal cell carcinoma   . Breast cancer ([place])   . Graves disease   . Joint pain   . Graves disease 1999    she was treatmented with medications 3 times, in remission for the past 3 years ago  . Anxiety     Past Surgical History  Procedure Laterality Date  . Cryotherapy  3/01    CIN1  . Wisdom tooth extraction      There were no vitals filed for this visit.  Visit Diagnosis:  Musculoskeletal disorder involving upper trapezius muscle  Abnormal posture  Breast cancer, female, right  Right arm numbness      Subjective Assessment - 10/27/15 1031    Subjective Doing good, just trying to get things finished before my surgery next week.                         [facility] Adult PT Treatment/Exercise - 10/27/15 0001    Moist Heat Therapy   Number Minutes Moist Heat 15 Minutes   Moist Heat Location Cervical   Electrical Stimulation   Electrical Stimulation Location Bil cervical muscles/medial scapular borders   Electrical Stimulation Action IFC   Electrical Stimulation Parameters 80-150Hz  x 15 minutes   Electrical Stimulation Goals Pain   Manual Therapy   Soft tissue mobilization In Supine Soft and deep trigger point release to bil cervical muscles/upper traps.                    Short Term Clinic Goals - 10/27/15 1032    CC Short Term Goal  #3   Title Patient will be able to report she is able to use her right arm with >/= 25% less difficulty  80% improvement reported and not self limiting herself any longer with activities.    Status Achieved             Long Term Clinic Goals - 10/27/15 1033    CC Long Term Goal  #1   Title Patient will be able to report right upper trap pain decreased >/= 50% to tolerate daily tasks with greater ease  40-50% improvement reported with this   Status Achieved   CC Long Term Goal  #2   Title Patient will be able to report she is able to use her right arm with >/= 50% less difficulty  80% improvement with this reported   Status Achieved   CC Long Term Goal  #  3   Title Patient will be able to report >/= 25% less numbness intensity and incidence in her right arm during the night.  90% improvement with the numbeness, just feel it at night now some    Status Achieved            Plan - 10/27/15 1113    Clinical Impression Statement Pt has made really good progress with therapy and has met all goals at htis time and she reports feeling much improved.    Pt will benefit from skilled therapeutic intervention in order to improve on the following deficits Decreased range of motion;Increased fascial restricitons;Pain;Impaired UE functional use;Decreased knowledge of precautions;Decreased strength;Increased muscle spasms   Rehab Potential Excellent   Clinical Impairments Affecting Rehab Potential Active breast cancer, bil masectomy surgery planned for 11/01/15.   PT Frequency 2x / week   PT Duration 8 weeks   PT Treatment/Interventions ADLs/Self Care Home Management;Traction;Passive range of motion;Patient/family education;Electrical  Stimulation;Therapeutic exercise;Manual techniques;Moist Heat   PT Next Visit Plan Pt on hold until cleared from MD to continue therapy after her bil masectomy with SLNB planned for 11/01/15.   Consulted and Agree with Plan of Care Patient        Problem List Patient Active Problem List   Diagnosis Date Noted  . Genetic testing 10/06/2015  . Breast cancer of upper-outer quadrant of right female breast ([place]) 09/23/2015  . Grave's disease     Otelia Limes, [place] 10/27/2015, 11:21 AM  [place], [place], [address] Phone: [phone]   Fax:  [phone]  Name: Amber Pugh MRN: [id number] Date of Birth: 01/03/1971

## 2015-11-01 ENCOUNTER — Encounter (HOSPITAL_BASED_OUTPATIENT_CLINIC_OR_DEPARTMENT_OTHER)
Admission: RE | Disposition: A | Discharge status: Home / Self Care | Payer: Self-pay | Source: Ambulatory Visit | Attending: General Surgery

## 2015-11-01 ENCOUNTER — Encounter (HOSPITAL_BASED_OUTPATIENT_CLINIC_OR_DEPARTMENT_OTHER): Payer: Self-pay | Admitting: Anesthesiology

## 2015-11-01 ENCOUNTER — Ambulatory Visit (HOSPITAL_BASED_OUTPATIENT_CLINIC_OR_DEPARTMENT_OTHER): Payer: BLUE CROSS/BLUE SHIELD | Admitting: Anesthesiology

## 2015-11-01 ENCOUNTER — Ambulatory Visit (HOSPITAL_BASED_OUTPATIENT_CLINIC_OR_DEPARTMENT_OTHER)
Admission: RE | Admit: 2015-11-01 | Discharge: 2015-11-02 | Disposition: A | Discharge status: Home / Self Care | Payer: BLUE CROSS/BLUE SHIELD | Source: Ambulatory Visit | Attending: General Surgery | Admitting: General Surgery

## 2015-11-01 ENCOUNTER — Encounter (HOSPITAL_COMMUNITY)
Admission: RE | Admit: 2015-11-01 | Discharge: 2015-11-01 | Disposition: A | Discharge status: Home / Self Care | Payer: BLUE CROSS/BLUE SHIELD | Source: Ambulatory Visit | Attending: General Surgery | Admitting: General Surgery

## 2015-11-01 DIAGNOSIS — Z4001 Encounter for prophylactic removal of breast: Secondary | ICD-10-CM | POA: Insufficient documentation

## 2015-11-01 DIAGNOSIS — F419 Anxiety disorder, unspecified: Secondary | ICD-10-CM | POA: Diagnosis not present

## 2015-11-01 DIAGNOSIS — Z85828 Personal history of other malignant neoplasm of skin: Secondary | ICD-10-CM | POA: Insufficient documentation

## 2015-11-01 DIAGNOSIS — E0500 Thyrotoxicosis with diffuse goiter without thyrotoxic crisis or storm: Secondary | ICD-10-CM | POA: Diagnosis not present

## 2015-11-01 DIAGNOSIS — N6012 Diffuse cystic mastopathy of left breast: Secondary | ICD-10-CM | POA: Insufficient documentation

## 2015-11-01 DIAGNOSIS — C50411 Malignant neoplasm of upper-outer quadrant of right female breast: Secondary | ICD-10-CM

## 2015-11-01 DIAGNOSIS — D242 Benign neoplasm of left breast: Secondary | ICD-10-CM | POA: Diagnosis not present

## 2015-11-01 DIAGNOSIS — Z79899 Other long term (current) drug therapy: Secondary | ICD-10-CM | POA: Diagnosis not present

## 2015-11-01 HISTORY — PX: MASTECTOMY W/ SENTINEL NODE BIOPSY: SHX2001

## 2015-11-01 HISTORY — DX: Anxiety disorder, unspecified: F41.9

## 2015-11-01 SURGERY — MASTECTOMY WITH SENTINEL LYMPH NODE BIOPSY
Anesthesia: General | Site: Breast | Laterality: Bilateral

## 2015-11-01 MED ORDER — PHENYLEPHRINE HCL 10 MG/ML IJ SOLN
INTRAMUSCULAR | Status: AC
  Filled 2015-11-01: qty 1

## 2015-11-01 MED ORDER — MIDAZOLAM HCL 2 MG/2ML IJ SOLN
INTRAMUSCULAR | Status: AC
  Filled 2015-11-01: qty 4

## 2015-11-01 MED ORDER — CIPROFLOXACIN IN D5W 400 MG/200ML IV SOLN
400.0000 mg | INTRAVENOUS | Status: AC
  Administered 2015-11-01: 400 mg via INTRAVENOUS

## 2015-11-01 MED ORDER — LACTATED RINGERS IV SOLN
INTRAVENOUS | Status: DC
  Administered 2015-11-01 (×2): via INTRAVENOUS

## 2015-11-01 MED ORDER — DEXAMETHASONE SODIUM PHOSPHATE 10 MG/ML IJ SOLN
INTRAMUSCULAR | Status: AC
  Filled 2015-11-01: qty 1

## 2015-11-01 MED ORDER — TECHNETIUM TC 99M SULFUR COLLOID FILTERED
1.0000 | Freq: Once | INTRAVENOUS | Status: AC | PRN
  Administered 2015-11-01: 1 via INTRADERMAL

## 2015-11-01 MED ORDER — BUPIVACAINE-EPINEPHRINE (PF) 0.5% -1:200000 IJ SOLN
INTRAMUSCULAR | Status: DC | PRN
  Administered 2015-11-01: 50 mL

## 2015-11-01 MED ORDER — PROPOFOL 10 MG/ML IV BOLUS
INTRAVENOUS | Status: DC | PRN
  Administered 2015-11-01: 150 mg via INTRAVENOUS

## 2015-11-01 MED ORDER — OXYCODONE HCL 5 MG PO TABS
5.0000 mg | ORAL_TABLET | Freq: Once | ORAL | Status: AC | PRN
  Administered 2015-11-01: 5 mg via ORAL
  Filled 2015-11-01: qty 1

## 2015-11-01 MED ORDER — FENTANYL CITRATE (PF) 100 MCG/2ML IJ SOLN
25.0000 ug | INTRAMUSCULAR | Status: DC | PRN
  Administered 2015-11-01: 25 ug via INTRAVENOUS

## 2015-11-01 MED ORDER — PROMETHAZINE HCL 25 MG/ML IJ SOLN
6.2500 mg | INTRAMUSCULAR | Status: AC | PRN
  Administered 2015-11-01 – 2015-11-02 (×2): 6.25 mg via INTRAVENOUS
  Filled 2015-11-01: qty 1

## 2015-11-01 MED ORDER — EPHEDRINE SULFATE 50 MG/ML IJ SOLN
INTRAMUSCULAR | Status: AC
  Filled 2015-11-01: qty 1

## 2015-11-01 MED ORDER — CIPROFLOXACIN IN D5W 400 MG/200ML IV SOLN
INTRAVENOUS | Status: AC
  Filled 2015-11-01: qty 200

## 2015-11-01 MED ORDER — ONDANSETRON HCL 4 MG/2ML IJ SOLN
4.0000 mg | Freq: Once | INTRAMUSCULAR | Status: AC
  Administered 2015-11-01: 4 mg via INTRAVENOUS

## 2015-11-01 MED ORDER — FENTANYL CITRATE (PF) 100 MCG/2ML IJ SOLN
INTRAMUSCULAR | Status: AC
  Filled 2015-11-01: qty 4

## 2015-11-01 MED ORDER — ATROPINE SULFATE 0.4 MG/ML IJ SOLN
INTRAMUSCULAR | Status: AC
  Filled 2015-11-01: qty 1

## 2015-11-01 MED ORDER — SUCCINYLCHOLINE CHLORIDE 20 MG/ML IJ SOLN
INTRAMUSCULAR | Status: AC
  Filled 2015-11-01: qty 1

## 2015-11-01 MED ORDER — PROPOFOL 10 MG/ML IV BOLUS
INTRAVENOUS | Status: AC
  Filled 2015-11-01: qty 20

## 2015-11-01 MED ORDER — ACETAMINOPHEN 650 MG RE SUPP: 650.0000 mg | Freq: Four times a day (QID) | RECTAL | Status: DC | PRN

## 2015-11-01 MED ORDER — FENTANYL CITRATE (PF) 100 MCG/2ML IJ SOLN
INTRAMUSCULAR | Status: AC
  Filled 2015-11-01: qty 2

## 2015-11-01 MED ORDER — PROMETHAZINE HCL 25 MG/ML IJ SOLN
INTRAMUSCULAR | Status: AC
  Filled 2015-11-01: qty 1

## 2015-11-01 MED ORDER — SODIUM CHLORIDE 0.9 % IJ SOLN
INTRAMUSCULAR | Status: AC
  Filled 2015-11-01: qty 10

## 2015-11-01 MED ORDER — ACETAMINOPHEN 325 MG PO TABS: 650.0000 mg | ORAL_TABLET | Freq: Four times a day (QID) | ORAL | Status: DC | PRN

## 2015-11-01 MED ORDER — LIDOCAINE HCL (CARDIAC) 20 MG/ML IV SOLN
INTRAVENOUS | Status: DC | PRN
  Administered 2015-11-01: 35 mg via INTRAVENOUS

## 2015-11-01 MED ORDER — MIDAZOLAM HCL 2 MG/2ML IJ SOLN
INTRAMUSCULAR | Status: AC
  Filled 2015-11-01: qty 2

## 2015-11-01 MED ORDER — ONDANSETRON HCL 4 MG/2ML IJ SOLN: 4.0000 mg | Freq: Once | INTRAMUSCULAR | Status: DC | PRN

## 2015-11-01 MED ORDER — OXYCODONE HCL 5 MG/5ML PO SOLN: 5.0000 mg | Freq: Once | ORAL | Status: AC | PRN

## 2015-11-01 MED ORDER — ONDANSETRON HCL 4 MG/2ML IJ SOLN
INTRAMUSCULAR | Status: AC
  Filled 2015-11-01: qty 2

## 2015-11-01 MED ORDER — SODIUM CHLORIDE 0.9 % IV SOLN
INTRAVENOUS | Status: DC
  Administered 2015-11-01: 16:00:00 via INTRAVENOUS

## 2015-11-01 MED ORDER — METHYLENE BLUE 1 % INJ SOLN
INTRAMUSCULAR | Status: AC
  Filled 2015-11-01: qty 10

## 2015-11-01 MED ORDER — SCOPOLAMINE 1 MG/3DAYS TD PT72: 1.0000 | MEDICATED_PATCH | Freq: Once | TRANSDERMAL | Status: DC | PRN

## 2015-11-01 MED ORDER — ONDANSETRON HCL 4 MG/2ML IJ SOLN
INTRAMUSCULAR | Status: DC | PRN
  Administered 2015-11-01: 4 mg via INTRAVENOUS

## 2015-11-01 MED ORDER — GLYCOPYRROLATE 0.2 MG/ML IJ SOLN
INTRAMUSCULAR | Status: AC
  Filled 2015-11-01: qty 1

## 2015-11-01 MED ORDER — LIDOCAINE HCL (CARDIAC) 20 MG/ML IV SOLN
INTRAVENOUS | Status: AC
Start: 2015-11-01 — End: 2015-11-01
  Filled 2015-11-01: qty 5

## 2015-11-01 MED ORDER — METHYLENE BLUE 1 % INJ SOLN
INTRAMUSCULAR | Status: DC | PRN
  Administered 2015-11-01: 5 mL

## 2015-11-01 MED ORDER — ONDANSETRON 4 MG PO TBDP
4.0000 mg | ORAL_TABLET | Freq: Four times a day (QID) | ORAL | Status: DC | PRN
  Administered 2015-11-02: 4 mg via ORAL

## 2015-11-01 MED ORDER — ONDANSETRON HCL 4 MG/2ML IJ SOLN: 4.0000 mg | Freq: Four times a day (QID) | INTRAMUSCULAR | Status: DC | PRN

## 2015-11-01 MED ORDER — GLYCOPYRROLATE 0.2 MG/ML IJ SOLN: 0.2000 mg | Freq: Once | INTRAMUSCULAR | Status: DC | PRN

## 2015-11-01 MED ORDER — METHOCARBAMOL 500 MG PO TABS: 500.0000 mg | ORAL_TABLET | Freq: Four times a day (QID) | ORAL | Status: DC | PRN

## 2015-11-01 MED ORDER — FENTANYL CITRATE (PF) 100 MCG/2ML IJ SOLN
50.0000 ug | INTRAMUSCULAR | Status: AC | PRN
  Administered 2015-11-01 (×3): 50 ug via INTRAVENOUS
  Administered 2015-11-01: 100 ug via INTRAVENOUS

## 2015-11-01 MED ORDER — OXYCODONE HCL 5 MG PO TABS
5.0000 mg | ORAL_TABLET | ORAL | Status: DC | PRN
  Administered 2015-11-01: 5 mg via ORAL
  Administered 2015-11-02: 10 mg via ORAL
  Filled 2015-11-01: qty 2
  Filled 2015-11-01 (×2): qty 1

## 2015-11-01 MED ORDER — MORPHINE SULFATE (PF) 2 MG/ML IV SOLN: 2.0000 mg | INTRAVENOUS | Status: DC | PRN

## 2015-11-01 MED ORDER — MIDAZOLAM HCL 2 MG/2ML IJ SOLN
1.0000 mg | INTRAMUSCULAR | Status: DC | PRN
  Administered 2015-11-01: 2 mg via INTRAVENOUS
  Administered 2015-11-01 (×3): 1 mg via INTRAVENOUS

## 2015-11-01 MED ORDER — DEXAMETHASONE SODIUM PHOSPHATE 4 MG/ML IJ SOLN
INTRAMUSCULAR | Status: DC | PRN
  Administered 2015-11-01: 10 mg via INTRAVENOUS

## 2015-11-01 SURGICAL SUPPLY — 66 items
APPLIER CLIP 11 MED OPEN (CLIP)
APPLIER CLIP 9.375 MED OPEN (MISCELLANEOUS)
BENZOIN TINCTURE PRP APPL 2/3 (GAUZE/BANDAGES/DRESSINGS) ×2 IMPLANT
BINDER BREAST MEDIUM (GAUZE/BANDAGES/DRESSINGS) ×2 IMPLANT
BIOPATCH RED 1 DISK 7.0 (GAUZE/BANDAGES/DRESSINGS) ×4 IMPLANT
BLADE CLIPPER SURG (BLADE) IMPLANT
BLADE HEX COATED 2.75 (ELECTRODE) ×2 IMPLANT
BLADE SURG 10 STRL SS (BLADE) ×2 IMPLANT
BLADE SURG 15 STRL LF DISP TIS (BLADE) ×2 IMPLANT
BLADE SURG 15 STRL SS (BLADE) ×2
CANISTER SUCT 1200ML W/VALVE (MISCELLANEOUS) ×2 IMPLANT
CHLORAPREP W/TINT 26ML (MISCELLANEOUS) ×2 IMPLANT
CLIP APPLIE 11 MED OPEN (CLIP) IMPLANT
CLIP APPLIE 9.375 MED OPEN (MISCELLANEOUS) IMPLANT
COVER BACK TABLE 60X90IN (DRAPES) ×2 IMPLANT
COVER MAYO STAND STRL (DRAPES) ×2 IMPLANT
COVER PROBE W GEL 5X96 (DRAPES) ×2 IMPLANT
DECANTER SPIKE VIAL GLASS SM (MISCELLANEOUS) IMPLANT
DEVICE DISSECT PLASMABLAD 3.0S (MISCELLANEOUS) IMPLANT
DRAIN CHANNEL 19F RND (DRAIN) ×4 IMPLANT
DRAPE LAPAROSCOPIC ABDOMINAL (DRAPES) ×2 IMPLANT
DRSG PAD ABDOMINAL 8X10 ST (GAUZE/BANDAGES/DRESSINGS) ×4 IMPLANT
DRSG TEGADERM 2-3/8X2-3/4 SM (GAUZE/BANDAGES/DRESSINGS) ×4 IMPLANT
ELECT REM PT RETURN 9FT ADLT (ELECTROSURGICAL) ×2
ELECTRODE REM PT RTRN 9FT ADLT (ELECTROSURGICAL) ×1 IMPLANT
EVACUATOR SILICONE 100CC (DRAIN) ×4 IMPLANT
GLOVE BIO SURGEON STRL SZ7 (GLOVE) ×4 IMPLANT
GLOVE BIOGEL M 7.0 STRL (GLOVE) ×2 IMPLANT
GLOVE BIOGEL PI IND STRL 7.5 (GLOVE) ×2 IMPLANT
GLOVE BIOGEL PI INDICATOR 7.5 (GLOVE) ×2
GLOVE EXAM NITRILE EXT CUFF MD (GLOVE) ×2 IMPLANT
GOWN STRL REUS W/ TWL LRG LVL3 (GOWN DISPOSABLE) ×2 IMPLANT
GOWN STRL REUS W/TWL LRG LVL3 (GOWN DISPOSABLE) ×2
ILLUMINATOR WAVEGUIDE N/F (MISCELLANEOUS) IMPLANT
LIGHT WAVEGUIDE WIDE FLAT (MISCELLANEOUS) ×2 IMPLANT
LIQUID BAND (GAUZE/BANDAGES/DRESSINGS) ×4 IMPLANT
MARKER SKIN DUAL TIP RULER LAB (MISCELLANEOUS) ×2 IMPLANT
NDL SAFETY ECLIPSE 18X1.5 (NEEDLE) ×1 IMPLANT
NEEDLE HYPO 18GX1.5 SHARP (NEEDLE) ×1
NEEDLE HYPO 25X1 1.5 SAFETY (NEEDLE) ×4 IMPLANT
NS IRRIG 1000ML POUR BTL (IV SOLUTION) ×2 IMPLANT
PACK BASIN DAY SURGERY FS (CUSTOM PROCEDURE TRAY) ×2 IMPLANT
PENCIL BUTTON HOLSTER BLD 10FT (ELECTRODE) ×2 IMPLANT
PIN SAFETY STERILE (MISCELLANEOUS) ×2 IMPLANT
PLASMABLADE 3.0S (MISCELLANEOUS)
SHEET MEDIUM DRAPE 40X70 STRL (DRAPES) IMPLANT
SLEEVE SCD COMPRESS KNEE MED (MISCELLANEOUS) ×2 IMPLANT
SPONGE GAUZE 4X4 12PLY STER LF (GAUZE/BANDAGES/DRESSINGS) IMPLANT
SPONGE LAP 18X18 X RAY DECT (DISPOSABLE) ×6 IMPLANT
SPONGE LAP 4X18 X RAY DECT (DISPOSABLE) IMPLANT
STAPLER VISISTAT 35W (STAPLE) ×2 IMPLANT
STOCKINETTE IMPERVIOUS LG (DRAPES) IMPLANT
STRIP CLOSURE SKIN 1/2X4 (GAUZE/BANDAGES/DRESSINGS) ×6 IMPLANT
SUT ETHILON 2 0 FS 18 (SUTURE) ×4 IMPLANT
SUT MNCRL AB 4-0 PS2 18 (SUTURE) ×4 IMPLANT
SUT SILK 2 0 SH (SUTURE) ×4 IMPLANT
SUT VIC AB 3-0 54X BRD REEL (SUTURE) IMPLANT
SUT VIC AB 3-0 BRD 54 (SUTURE)
SUT VIC AB 3-0 SH 27 (SUTURE)
SUT VIC AB 3-0 SH 27X BRD (SUTURE) IMPLANT
SUT VICRYL 3-0 CR8 SH (SUTURE) ×6 IMPLANT
SYR CONTROL 10ML LL (SYRINGE) ×4 IMPLANT
TOWEL OR 17X24 6PK STRL BLUE (TOWEL DISPOSABLE) ×4 IMPLANT
TOWEL OR NON WOVEN STRL DISP B (DISPOSABLE) ×2 IMPLANT
TUBE CONNECTING 20X1/4 (TUBING) ×2 IMPLANT
YANKAUER SUCT BULB TIP NO VENT (SUCTIONS) ×2 IMPLANT

## 2015-11-01 NOTE — H&P (Signed)
Amber Pugh is an 44 y.o. female.   Chief Complaint: breast cancer HPI: 25 yof who is otherwise healthy except for graves disease who palpated a right breast mass. she has undergone evaluation and was found to have a 12 oclock mass measuring 2.5 cm. US of the right breast showed a 2.5 cm mass that is one seen on mm as well as a 62mm mass at 9 oclock and a 3 mm mass at 10 oclock. the 2.5 cm mass and the 8 mm mass were both biopsied and these clips are 6 cm apart. the 3 mm mass has not been biopsied. she underwent mri that showed a 1.2 cm mass with satellite lesions (this is the 2.5 cm mass) and the 8 mm mass with other enhancement. there is also a left breast lesion that is present. 2nd look [place] is negative. the biopsies of the two right sided lesions are both grade I ILC that is er/pr pos at 90%, her 2 negative and Ki is 20%. she has no discharge.  Her Graves disease has been in remission for several years now she states. she has attended some support groups and researched options. she does not want reconstruction and wants to proceed with bilateral mastectomies with right axillary sn biopsy   Past Medical History  Diagnosis Date  . Grave's disease     GRAVES DISEASE  . Migraines     without aura  . Abnormal Pap smear of cervix 6/02    +HPV HR, CIN1  . MVP (mitral valve prolapse)     neg cardio workup  . Melasma   . Basal cell carcinoma   . Breast cancer ([place])   . Graves disease   . Joint pain   . Graves disease 1999    she was treatmented with medications 3 times, in remission for the past 3 years ago  . Anxiety     Past Surgical History  Procedure Laterality Date  . Cryotherapy  3/01    CIN1  . Wisdom tooth extraction      Family History  Problem Relation Age of Onset  . Hypertension Father   . Diabetes Father   . Skin cancer Father     unknown type  . Gout Brother   . Hypertension Brother   . Pulmonary embolism Maternal Grandfather   . Diabetes Maternal  Grandfather   . Prostate cancer Other     MGMs brother dx in his 72s-80s   Social History:  reports that she has never smoked. She does not have any smokeless tobacco history on file. She reports that she does not drink alcohol or use illicit drugs.  Allergies:  Allergies  Allergen Reactions  . Cefdinir     GI upset, nausea,vomiting & diarrhea (Omnicef)  . Tetracyclines & Related Other (See Comments)    Headache,nausea, stomach ulcer    Medications Prior to Admission  Medication Sig Dispense Refill  . ASTRAGALUS PO Take 500 mg by mouth 2 (two) times daily.    Marland Kitchen CALCIUM PO Take by mouth as needed.    . Multiple Vitamins-Minerals (MULTIVITAMIN PO) Take by mouth daily.    Jonna Coup Leaf Extract 500 MG CAPS Take 1 capsule by mouth 2 (two) times daily.    . Turmeric 500 MG CAPS Take by mouth.    Marland Kitchen UNABLE TO FIND Rosemary , Tumeric, ginger  Two to Three times/day      No results found for this or any previous visit (from the past  48 hour(s)). No results found.  ROS Review of Systems  General Present- Fatigue, Night Sweats and Weight Loss. Not Present- Chills, Fever and Weight Gain. Skin Not Present- Change in Wart/Mole, Dryness, Hives, Jaundice, New Lesions, Non-Healing Wounds, Rash and Ulcer. HEENT Present- Wears glasses/contact lenses. Not Present- Earache, Hearing Loss, Hoarseness, Nose Bleed, Oral Ulcers, Ringing in the Ears, Seasonal Allergies, Sinus Pain, Sore Throat, Visual Disturbances and Yellow Eyes. Breast Present- Breast Pain. Not Present- Breast Mass, Nipple Discharge and Skin Changes. Cardiovascular Not Present- Chest Pain, Difficulty Breathing Lying Down, Leg Cramps, Palpitations, Rapid Heart Rate, Shortness of Breath and Swelling of Extremities. Gastrointestinal Not Present- Abdominal Pain, Bloating, Bloody Stool, Change in Bowel Habits, Chronic diarrhea, Constipation, Difficulty Swallowing, Excessive gas, Gets full quickly at meals, Hemorrhoids, Indigestion, Nausea,  Rectal Pain and Vomiting. Female Genitourinary Not Present- Frequency, Nocturia, Painful Urination, Pelvic Pain and Urgency. Musculoskeletal Present- Back Pain, Joint Stiffness and Swelling of Extremities. Not Present- Joint Pain, Muscle Pain and Muscle Weakness. Neurological Present- Numbness. Not Present- Decreased Memory, Fainting, Headaches, Seizures, Tingling, Tremor, Trouble walking and Weakness. Psychiatric Present- Anxiety and Fearful. Not Present- Bipolar, Change in Sleep Pattern, Depression and Frequent crying. Endocrine Present- Hair Changes. Not Present- Cold Intolerance, Excessive Hunger, Heat Intolerance, Hot flashes and New Diabetes. Hematology Present- Easy Bruising. Not Present- Excessive bleeding, Gland problems, HIV and Persistent Infections.   Blood pressure 113/70, pulse 70, temperature 98.6 F (37 C), temperature source Oral, resp. rate 20, height 5\' 6"  (1.676 m), weight 65.772 kg (145 lb), last menstrual period 10/06/2015, SpO2 100 %. Physical Exam  Physical Exam  General Mental Status-Alert. Orientation-Oriented X3. Chest and Lung Exam Chest and lung exam reveals -on auscultation, normal breath sounds, no adventitious sounds and normal vocal resonance. Breast Nipples-No Discharge. Breast Lump-No Palpable Breast Mass. Cardiovascular Cardiovascular examination reveals -normal heart sounds, regular rate and rhythm with no murmurs. Lymphatic Head & Neck General Head & Neck Lymphatics: Bilateral - Description - Normal. Axillary General Axillary Region: Bilateral - Description - Normal. Note: no Justice adenopathy  Assessment/Plan  CANCER OF UPPER-OUTER QUADRANT OF FEMALE BREAST (C50.419) Right total mastectomy, right axillary sentinel node biopsy, left prophylactic total mastectomy she very much would like to undergo bilateral mastectomy. we discussed again today this is not mandatory but given her age and lobular cancer she desires this which I think is  reasonable. she does not want to undergo any reconstruction. we discussed risks of mastectomy and node biopsy (including lymphedema), hospital stay and recovery. will plan on doing in next couple of weeks. her husband is also undergoing chemotherapy for colon cancer right now  [facility] 11/01/2015, 10:55 AM

## 2015-11-01 NOTE — Op Note (Signed)
Preoperative diagnosis: stage II right breast cancer Postoperative diagnosis: same as above Procedure: 1. Left prophylactic total mastectomy 2. Right total mastectomy 3. Injection of methylene blue for sn identification 4. Right axillary sentinel node biopsy Surgeon Dr Serita Grammes Anes general with bilateral pectoral block EBL: 75 cc Comps none Drains 19 Fr blake to either side Specimen 1. Left mastectomy short stitch superior, long stitch lateral 2. Right mastectomy short stitch superior, long lateral 3. Right axillary sn with count of 224/blue Sponge count correct at completion dispo to recovery stable  Indications: this is a 78 yof with newly diagnosed stage II right lobular breast cancer. After being seen in the multidisciplinary clinic she elected to undergo bilateral mastectomies. She saw plastic surgery and elected not to have reconstruction.    Procedure: After informed consent was obtained the patient was taken to the OR. She was given ciprofloxacin. SCDs were in place. She was prepped and draped in the standard sterile surgical fashion. A timeout was performed.  I performed the prophylactic side first.  I made a large elliptical incision on the right side encompassing the nipple areola. I created flaps to the parasternal region, inframammary crease, latissimus and clavicle. The breast tissue was then removed from the pectoralis including the fascia.  I then obtained hemostasis.  A 19 Fr Blake drain was placed.  I closed with 3-0 vicryl and 4-0 monocryl. Glue and steristrips were applied. I then turned attention to the right side. I didn't have a great signal in the axilla for technetium.  I injected a mixture of methylene blue dye and saline in the periareolar position and massaged.  I then made a similar incision that included biopsy sites on the right side. I created flaps.  I removed the breast in a similar fashion and passed off the table after marking.  I then located a  sentinel node with count as above that was also above. There was no more radioactivity or blue dye. I placed another drain, obtained hemostasis and closed the same as the other side.   A breast binder was placed. She was extubated and transferred to recovery stable

## 2015-11-01 NOTE — Interval H&P Note (Signed)
History and Physical Interval Note:  11/01/2015 12:10 PM  Amber Pugh  has presented today for surgery, with the diagnosis of RIGHT BREAST CANCER  The various methods of treatment have been discussed with the patient and family. After consideration of risks, benefits and other options for treatment, the patient has consented to  Procedure(s): BILATERAL TOTAL MASTECTOMY WITH RIGHT SENTINEL LYMPH NODE BIOPSY (Bilateral) as a surgical intervention .  The patient's history has been reviewed, patient examined, no change in status, stable for surgery.  I have reviewed the patient's chart and labs.  Questions were answered to the patient's satisfaction.     Amber Pugh

## 2015-11-01 NOTE — Anesthesia Postprocedure Evaluation (Signed)
  Anesthesia Post-op Note  Patient: Amber Pugh  Procedure(s) Performed: Procedure(s): BILATERAL TOTAL MASTECTOMY WITH RIGHT SENTINEL LYMPH NODE BIOPSY (Bilateral)  Patient Location: PACU  Anesthesia Type:General and GA combined with regional for post-op pain  Level of Consciousness: awake, alert  and oriented  Airway and Oxygen Therapy: Patient Spontanous Breathing and Patient connected to nasal cannula oxygen  Post-op Pain: mild  Post-op Assessment: Post-op Vital signs reviewed, Patient's Cardiovascular Status Stable, Respiratory Function Stable, Patent Airway and Pain level controlled              Post-op Vital Signs: stable  Last Vitals:  Filed Vitals:   11/01/15 1630  BP: 126/88  Pulse: 74  Temp: 36.8 C  Resp: 14    Complications: No apparent anesthesia complications

## 2015-11-01 NOTE — Discharge Instructions (Signed)
[facility] [place] °[phone] ° °MASTECTOMY: POST OP INSTRUCTIONS ° °Always review your discharge instruction sheet given to you by the facility where your surgery was performed. °IF YOU HAVE DISABILITY OR FAMILY LEAVE FORMS, YOU MUST BRING THEM TO THE OFFICE FOR PROCESSING.   °DO NOT GIVE THEM TO YOUR DOCTOR. °A prescription for pain medication may be given to you upon discharge.  Take your pain medication as prescribed, if needed.  If narcotic pain medicine is not needed, then you may take acetaminophen (Tylenol), naprosyn (Alleve) or ibuprofen (Advil) as needed. °1. Take your usually prescribed medications unless otherwise directed. °2. If you need a refill on your pain medication, please contact your pharmacy.  They will contact our office to request authorization.  Prescriptions will not be filled after 5pm or on week-ends. °3. You should follow a light diet the first few days after arrival home, such as soup and crackers, etc.  Resume your normal diet the day after surgery. °4. Most patients will experience some swelling and bruising on the chest and underarm.  Ice packs will help.  Swelling and bruising can take several days to resolve. Wear the binder day and night until you return to the office.  °5. It is common to experience some constipation if taking pain medication after surgery.  Increasing fluid intake and taking a stool softener (such as Colace) will usually help or prevent this problem from occurring.  A mild laxative (Milk of Magnesia or Miralax) should be taken according to package instructions if there are no bowel movements after 48 hours. °6. Unless discharge instructions indicate otherwise, leave your bandage dry and in place until your next appointment in 3-5 days.  You may take a limited sponge bath.  No tube baths or showers until the drains are removed.  You may have steri-strips (small skin tapes) in place directly over the incision.  These strips should be left on the  skin for 7-10 days. If you have glue it will come off in next couple week.  Any sutures will be removed at an office visit °7. DRAINS:  If you have drains in place, it is important to keep a list of the amount of drainage produced each day in your drains.  Before leaving the hospital, you should be instructed on drain care.  Call our office if you have any questions about your drains. I will remove your drains when they put out less than 30 cc or ml for 2 consecutive days. °8. ACTIVITIES:  You may resume regular (light) daily activities beginning the next day--such as daily self-care, walking, climbing stairs--gradually increasing activities as tolerated.  You may have sexual intercourse when it is comfortable.  Refrain from any heavy lifting or straining until approved by your doctor. °a. You may drive when you are no longer taking prescription pain medication, you can comfortably wear a seatbelt, and you can safely maneuver your car and apply brakes. °b. RETURN TO WORK:  __________________________________________________________ °9. You should see your doctor in the office for a follow-up appointment approximately 3-5 days after your surgery.  Your doctor’s nurse will typically make your follow-up appointment when she calls you with your pathology report.  Expect your pathology report 3-4business days after surgery. °10. OTHER INSTRUCTIONS: ______________________________________________________________________________________________ ____________________________________________________________________________________________ °WHEN TO CALL YOUR DR WAKEFIELD: °1. Fever over 101.0 °2. Nausea and/or vomiting °3. Extreme swelling or bruising °4. Continued bleeding from incision. °5. Increased pain, redness, or drainage from the incision. °The clinic staff is available   to answer your questions during regular business hours.  Please dont hesitate to call and ask to speak to one of the nurses for clinical concerns.  If  you have a medical emergency, go to the nearest emergency room or call 911.  A surgeon from [facility] is always on call at the hospital. [address] [place], [place], [place]  [address] ? [address], [place], [place]   [address] [phone] ? [phone] ? FAX [phone] Web site: [email or web address]  Post Anesthesia Home Care Instructions  Activity: Get plenty of rest for the remainder of the day. A responsible adult should stay with you for 24 hours following the procedure.  For the next 24 hours, DO NOT: -Drive a car -Paediatric nurse -Drink alcoholic beverages -Take any medication unless instructed by your physician -Make any legal decisions or sign important papers.  Meals: Start with liquid foods such as gelatin or soup. Progress to regular foods as tolerated. Avoid greasy, spicy, heavy foods. If nausea and/or vomiting occur, drink only clear liquids until the nausea and/or vomiting subsides. Call your physician if vomiting continues.  Special Instructions/Symptoms: Your throat may feel dry or sore from the anesthesia or the breathing tube placed in your throat during surgery. If this causes discomfort, gargle with warm salt water. The discomfort should disappear within 24 hours.  If you had a scopolamine patch placed behind your ear for the management of post- operative nausea and/or vomiting:  1. The medication in the patch is effective for 72 hours, after which it should be removed.  Wrap patch in a tissue and discard in the trash. Wash hands thoroughly with soap and water. 2. You may remove the patch earlier than 72 hours if you experience unpleasant side effects which may include dry mouth, dizziness or visual disturbances. 3. Avoid touching the patch. Wash your hands with soap and water after contact with the patch.

## 2015-11-01 NOTE — Progress Notes (Signed)
Assisted Dr. Lissa Hoard with right,and left, ultrasound guided, pectoralis blocks. Side rails up, monitors on throughout procedure. See vital signs in flow sheet. Tolerated Procedure well.

## 2015-11-01 NOTE — Transfer of Care (Signed)
Immediate Anesthesia Transfer of Care Note  Patient: Amber Pugh  Procedure(s) Performed: Procedure(s): BILATERAL TOTAL MASTECTOMY WITH RIGHT SENTINEL LYMPH NODE BIOPSY (Bilateral)  Patient Location: PACU  Anesthesia Type:GA combined with regional for post-op pain  Level of Consciousness: awake, sedated and responds to stimulation  Airway & Oxygen Therapy: Patient Spontanous Breathing and Patient connected to face mask oxygen  Post-op Assessment: Report given to RN, Post -op Vital signs reviewed and stable and Patient moving all extremities  Post vital signs: Reviewed and stable  Last Vitals:  Filed Vitals:   11/01/15 1425  BP:   Pulse: 71  Temp:   Resp: 20    Complications: No apparent anesthesia complications

## 2015-11-01 NOTE — Anesthesia Procedure Notes (Addendum)
Anesthesia Regional Block:  Pectoralis block  Pre-Anesthetic Checklist: ,, timeout performed, Correct Patient, Correct Site, Correct Laterality, Correct Procedure, Correct Position, site marked, Risks and benefits discussed, Surgical consent,  Pre-op evaluation,  Post-op pain management  Laterality: Left and Right  Prep: chloraprep       Needles:   Needle Type: Stimiplex     Needle Length: 9cm 9 cm     Additional Needles:  Procedures: ultrasound guided (picture in chart) Pectoralis block Narrative:  Injection made incrementally with aspirations every 5 mL.  Performed by: Personally  Anesthesiologist: Nolon Nations  Additional Notes: Patient tolerated well. Good fascial spread noted.   Procedure Name: LMA Insertion Performed by: Tawni Millers Pre-anesthesia Checklist: Patient identified, Emergency Drugs available, Suction available and Patient being monitored Patient Re-evaluated:Patient Re-evaluated prior to inductionOxygen Delivery Method: Circle System Utilized Preoxygenation: Pre-oxygenation with 100% oxygen Intubation Type: IV induction Ventilation: Mask ventilation without difficulty LMA: LMA inserted LMA Size: 4.0 Number of attempts: 1 Airway Equipment and Method: Bite block Placement Confirmation: positive ETCO2 Tube secured with: Tape Dental Injury: Teeth and Oropharynx as per pre-operative assessment

## 2015-11-01 NOTE — Anesthesia Preprocedure Evaluation (Signed)
Anesthesia Evaluation  Patient identified by MRN, date of birth, ID band Patient awake    Reviewed: Allergy & Precautions, NPO status , Patient's Chart, lab work & pertinent test results  Airway Mallampati: II  TM Distance: >3 FB Neck ROM: Full    Dental no notable dental hx.    Pulmonary neg pulmonary ROS,    Pulmonary exam normal breath sounds clear to auscultation       Cardiovascular negative cardio ROS Normal cardiovascular exam Rhythm:Regular Rate:Normal     Neuro/Psych  Headaches, PSYCHIATRIC DISORDERS Anxiety    GI/Hepatic negative GI ROS, Neg liver ROS,   Endo/Other  negative endocrine ROSHyperthyroidism   Renal/GU negative Renal ROS     Musculoskeletal negative musculoskeletal ROS (+)   Abdominal   Peds  Hematology negative hematology ROS (+)   Anesthesia Other Findings   Reproductive/Obstetrics negative OB ROS                             Anesthesia Physical Anesthesia Plan  ASA: II  Anesthesia Plan: General   Post-op Pain Management:    Induction: Intravenous  Airway Management Planned: LMA  Additional Equipment:   Intra-op Plan:   Post-operative Plan: Extubation in OR  Informed Consent: I have reviewed the patients History and Physical, chart, labs and discussed the procedure including the risks, benefits and alternatives for the proposed anesthesia with the patient or authorized representative who has indicated his/her understanding and acceptance.   Dental advisory given  Plan Discussed with: CRNA  Anesthesia Plan Comments:         Anesthesia Quick Evaluation

## 2015-11-02 ENCOUNTER — Encounter (HOSPITAL_BASED_OUTPATIENT_CLINIC_OR_DEPARTMENT_OTHER): Payer: Self-pay | Admitting: General Surgery

## 2015-11-02 DIAGNOSIS — C50411 Malignant neoplasm of upper-outer quadrant of right female breast: Secondary | ICD-10-CM | POA: Diagnosis not present

## 2015-11-02 MED ORDER — ONDANSETRON 8 MG PO TBDP
ORAL_TABLET | ORAL | Status: AC
  Filled 2015-11-02: qty 1

## 2015-11-02 MED ORDER — OXYCODONE HCL 5 MG PO TABS: 5.0000 mg | ORAL_TABLET | Freq: Four times a day (QID) | ORAL | Status: DC | PRN

## 2015-11-02 MED ORDER — PROMETHAZINE HCL 12.5 MG PO TABS: 12.5000 mg | ORAL_TABLET | Freq: Four times a day (QID) | ORAL | Status: DC | PRN

## 2015-11-04 ENCOUNTER — Encounter: Payer: Self-pay | Admitting: *Deleted

## 2015-11-04 NOTE — Progress Notes (Signed)
Ordered oncotype per Dr. Burr Medico.  Faxed PA to [facility] and requisition to pathology and confirmed receipt.

## 2015-11-14 ENCOUNTER — Telehealth: Payer: Self-pay | Admitting: Hematology

## 2015-11-14 ENCOUNTER — Encounter: Payer: Self-pay | Admitting: *Deleted

## 2015-11-14 ENCOUNTER — Other Ambulatory Visit: Payer: Self-pay | Admitting: Hematology

## 2015-11-14 NOTE — Telephone Encounter (Signed)
I called pt and discussed her Oncotype Dx test results. Her recurrence score is 13, low risk, I would not recommend adjuvant chemotherapy. She was very happy with the results.  I'll reschedule her next follow-up appointment with me to next month, and I will will discuss tamoxifen on her next visit.  Truitt Merle  11/14/2015

## 2015-11-14 NOTE — Progress Notes (Signed)
Received oncotype score of 13. Copy given to Dr. Burr Medico. Original to HIM for scanning.

## 2015-11-14 NOTE — Telephone Encounter (Signed)
per pof to sch pt appt-cld & spoke to pt and adv of appt r/s to 12/15. Pt understood

## 2015-11-15 ENCOUNTER — Other Ambulatory Visit: Payer: Self-pay | Admitting: *Deleted

## 2015-11-15 ENCOUNTER — Telehealth: Payer: Self-pay | Admitting: Hematology

## 2015-11-15 ENCOUNTER — Encounter (HOSPITAL_COMMUNITY): Payer: Self-pay

## 2015-11-15 DIAGNOSIS — C50411 Malignant neoplasm of upper-outer quadrant of right female breast: Secondary | ICD-10-CM

## 2015-11-15 NOTE — Telephone Encounter (Signed)
Spoke with patient re SCP visit 12/29. Other appointments remain the same.

## 2015-11-15 NOTE — Progress Notes (Signed)
Left vm for pt to return call to discuss referral to survivorship program Referral placed and appt requested.

## 2015-11-22 ENCOUNTER — Ambulatory Visit: Payer: BLUE CROSS/BLUE SHIELD

## 2015-11-22 ENCOUNTER — Ambulatory Visit: Payer: BLUE CROSS/BLUE SHIELD | Admitting: Hematology

## 2015-11-26 ENCOUNTER — Telehealth: Payer: Self-pay | Admitting: Surgery

## 2015-11-26 NOTE — Telephone Encounter (Signed)
Ms. Prose had bilateral mastectomies by Dr. Donne Hazel on 11/01/2015.  She is having some "green' drainage from her mastectomy site.  She has no redness or fever.  I reassured her to continue washing the wound with soap and water.  Not sure what would be green, thought she did get an injection with Methylene blue.  She will call for earlier follow up if this continues.  Alphonsa Overall, MD, [facility] Pager: [phone] Office phone:  [phone]

## 2015-11-28 ENCOUNTER — Ambulatory Visit: Payer: BLUE CROSS/BLUE SHIELD | Attending: General Surgery | Admitting: Physical Therapy

## 2015-11-28 DIAGNOSIS — M7582 Other shoulder lesions, left shoulder: Secondary | ICD-10-CM | POA: Diagnosis not present

## 2015-11-28 DIAGNOSIS — M25611 Stiffness of right shoulder, not elsewhere classified: Secondary | ICD-10-CM

## 2015-11-28 DIAGNOSIS — M25612 Stiffness of left shoulder, not elsewhere classified: Secondary | ICD-10-CM

## 2015-11-28 DIAGNOSIS — M25511 Pain in right shoulder: Secondary | ICD-10-CM | POA: Insufficient documentation

## 2015-11-28 NOTE — Therapy (Signed)
[place], [place], [address] Phone: [phone]   Fax:  [phone]  Physical Therapy Treatment  Patient Details  Name: Amber Pugh MRN: [id number] Date of Birth: [birth year] Referring Provider: Dr. Rolm Bookbinder  Encounter Date: 11/28/2015      PT End of Session - 11/28/15 1629    Visit Number 11   Number of Visits 19   Date for PT Re-Evaluation 12/31/15   PT Start Time 1440   PT Stop Time 1521   PT Time Calculation (min) 41 min   Activity Tolerance Patient tolerated treatment well;Patient limited by pain   Behavior During Therapy [facility] for tasks assessed/performed      Past Medical History  Diagnosis Date  . Grave's disease     GRAVES DISEASE  . Migraines     without aura  . Abnormal Pap smear of cervix 6/02    +HPV HR, CIN1  . MVP (mitral valve prolapse)     neg cardio workup  . Melasma   . Basal cell carcinoma   . Breast cancer ([facility])   . Graves disease   . Joint pain   . Graves disease 1999    she was treatmented with medications 3 times, in remission for the past 3 years ago  . Anxiety     Past Surgical History  Procedure Laterality Date  . Cryotherapy  3/01    CIN1  . Wisdom tooth extraction    . Mastectomy w/ sentinel node biopsy Bilateral 11/01/2015    Procedure: BILATERAL TOTAL MASTECTOMY WITH RIGHT SENTINEL LYMPH NODE BIOPSY;  Surgeon: Rolm Bookbinder, MD;  Location: [facility];  Service: General;  Laterality: Bilateral;    There were no vitals filed for this visit.  Visit Diagnosis:  Decreased ROM of left shoulder - Plan: PT plan of care cert/re-cert  Decreased ROM of right shoulder - Plan: PT plan of care cert/re-cert      Subjective Assessment - 11/28/15 1442    Subjective c/o tightness in chest area.  Reports she does exercises several times a day (wall walking for abduction, bilateral flexion, scapular retractiton, and arms back).   This past Thursday morning she noticed she had had some drainage from incisions.Hulen Skains MD on call on Saturday because oozing was greenish.  This has gone back to clearish-red on Saturday and Sunday.   Pertinent History Bilateral mastectomy 11/01/15 for right breast cancer; the left was prophylactic.  Right SLNB (2 nodes, both negative; also 2 in the breast tissue, both negative).  Surgeon (Dr. Donne Hazel) released her to come back to therapy.  Only other treatment may be hormonal therapy.  Therapy she had here and with massage therapist pre-op helped a lot.  No other health issues.   Patient Stated Goals "I want my 100% ROM back where I can garden, exercise, do yoga and tai chi--everything I did before."   Currently in Pain? Yes   Pain Score 9    Pain Location Chest   Pain Orientation Right;Left   Pain Descriptors / Indicators Discomfort;Tightness  "like an underwire bra that's being ratcheted around my body"   Pain Relieving Factors stretches help for a short period of time, but it is there 90% of the time            [facility] PT Assessment - 11/28/15 0001    Assessment   Referring Provider Dr. Rolm Bookbinder   Observation/Other Assessments   Observations Patient is wearing an  [employer] brand compression bra and has ABD pads between bra and skin of her chest on both sides.   Skin Integrity Bilateral mastectomy incisions still with steri strips in place; left lateral incision, just inferior to axilla, has one spot that is oozing a greenish creamy -appearing consistency substance.     ROM / Strength   AROM / PROM / Strength PROM   AROM   Right Shoulder Flexion 118 Degrees   Right Shoulder ABduction 114 Degrees   Right Shoulder Internal Rotation 80 Degrees  in supine   Right Shoulder External Rotation 83 Degrees  supine   Left Shoulder Flexion 117 Degrees   Left Shoulder ABduction 112 Degrees   Left Shoulder Internal Rotation 85 Degrees  supine   Left Shoulder External Rotation --   supine   PROM   PROM Assessment Site Shoulder   Right/Left Shoulder Right;Left   Right Shoulder Flexion 107 Degrees   Left Shoulder Flexion 110 Degrees                     [facility] Adult PT Treatment/Exercise - 11/28/15 0001    Self-Care   Self-Care Other Self-Care Comments   Other Self-Care Comments  Okayed patient to GENTLY oscillate tissue at her chest, through the fabric of her bra, as this feels comforting to her; she is not to do anything that causes pain.                   Short Term Clinic Goals - 10/27/15 1032    CC Short Term Goal  #3   Title Patient will be able to report she is able to use her right arm with >/= 25% less difficulty  80% improvement reported and not self limiting herself any longer with activities.    Status Achieved           Breast Clinic Goals - 09/28/15 1320    Patient will be able to verbalize understanding of pertinent lymphedema risk reduction practices relevant to her diagnosis specifically related to skin care.   Time 1   Period Days   Status Achieved   Patient will be able to return demonstrate and/or verbalize understanding of the post-op home exercise program related to regaining shoulder range of motion.   Time 1   Period Days   Status Achieved   Patient will be able to verbalize understanding of the importance of attending the postoperative After Breast Cancer Class for further lymphedema risk reduction education and therapeutic exercise.   Time 1   Period Days   Status Achieved          Long Term Clinic Goals - 11/28/15 1634    CC Long Term Goal  #4   Title Bilateral shoulder active flexion to at least 160 degrees for improved overhead reach.   Time 4   Period Weeks   Status New   CC Long Term Goal  #5   Title bilateral shoulder active abduction to at least 160 degrees for improved ADLs.   Time 4   Period Weeks   Status New   CC Long Term Goal  #6   Title independent with HEP for shoulder ROM and  strengthening   Time 4   Period Weeks   Status New   Additional Goals   Additional Goals Yes            Plan - 11/28/15 1630    Clinical Impression Statement Patient who did very well with therapy  prior to surgery now returns approx. 4 weeks post bilateral mastectomy with limited ROM in both shoulders.  She is still healing, with steri strips in place on incisions and with one area a lateral aspect of left mastectomy incision that is oozing, and about which patient will contact the surgeon's office.   Pt will benefit from skilled therapeutic intervention in order to improve on the following deficits Decreased range of motion;Impaired UE functional use;Decreased strength;Pain   Rehab Potential Excellent   Clinical Impairments Affecting Rehab Potential still healing from surgery 11/01/15 as of re-eval on 11/28/15   PT Frequency 2x / week   PT Duration 4 weeks   PT Treatment/Interventions Passive range of motion;Manual techniques;Therapeutic exercise;Patient/family education;ADLs/Self Care Home Management   PT Next Visit Plan Begin P/AA/AROM with manual techniques; modify shoulder ROM HEP as needed.  When healed, begin soft tissue mobilization and myofascial release to chest.   Consulted and Agree with Plan of Care Patient        Problem List Patient Active Problem List   Diagnosis Date Noted  . Breast cancer ([facility]) 11/01/2015  . Genetic testing 10/06/2015  . Breast cancer of upper-outer quadrant of right female breast ([place]) 09/23/2015  . Grave's disease     SALISBURY,DONNA 11/28/2015, 4:41 PM  [place], [place], [address] Phone: [phone]   Fax:  [phone]  Name: Amber Pugh MRN: [id number] Date of Birth: Oct 24, 1971    Serafina Royals, PT 11/28/2015 4:41 PM

## 2015-12-02 ENCOUNTER — Ambulatory Visit: Payer: BLUE CROSS/BLUE SHIELD | Attending: General Surgery | Admitting: Physical Therapy

## 2015-12-02 DIAGNOSIS — M5382 Other specified dorsopathies, cervical region: Secondary | ICD-10-CM | POA: Insufficient documentation

## 2015-12-02 DIAGNOSIS — M25511 Pain in right shoulder: Secondary | ICD-10-CM | POA: Diagnosis present

## 2015-12-02 DIAGNOSIS — M7582 Other shoulder lesions, left shoulder: Secondary | ICD-10-CM | POA: Diagnosis present

## 2015-12-02 DIAGNOSIS — M25612 Stiffness of left shoulder, not elsewhere classified: Secondary | ICD-10-CM

## 2015-12-02 DIAGNOSIS — M25611 Stiffness of right shoulder, not elsewhere classified: Secondary | ICD-10-CM

## 2015-12-02 NOTE — Therapy (Signed)
[place], [place], [address] Phone: [phone]   Fax:  [phone]  Physical Therapy Treatment  Patient Details  Name: Amber Pugh MRN: [id number] Date of Birth: Sep 10, 1971 Referring Provider: Dr. Rolm Bookbinder  Encounter Date: 12/02/2015      PT End of Session - 12/02/15 1057    Visit Number 12   Number of Visits 19   Date for PT Re-Evaluation 12/31/15   PT Start Time 0805   PT Stop Time 0845   PT Time Calculation (min) 40 min   Activity Tolerance Patient tolerated treatment well   Behavior During Therapy [facility] for tasks assessed/performed      Past Medical History  Diagnosis Date  . Grave's disease     GRAVES DISEASE  . Migraines     without aura  . Abnormal Pap smear of cervix 6/02    +HPV HR, CIN1  . MVP (mitral valve prolapse)     neg cardio workup  . Melasma   . Basal cell carcinoma   . Breast cancer ([place])   . Graves disease   . Joint pain   . Graves disease 1999    she was treatmented with medications 3 times, in remission for the past 3 years ago  . Anxiety     Past Surgical History  Procedure Laterality Date  . Cryotherapy  3/01    CIN1  . Wisdom tooth extraction    . Mastectomy w/ sentinel node biopsy Bilateral 11/01/2015    Procedure: BILATERAL TOTAL MASTECTOMY WITH RIGHT SENTINEL LYMPH NODE BIOPSY;  Surgeon: Rolm Bookbinder, MD;  Location: [place];  Service: General;  Laterality: Bilateral;    There were no vitals filed for this visit.  Visit Diagnosis:  Decreased ROM of left shoulder  Decreased ROM of right shoulder      Subjective Assessment - 12/02/15 0808    Subjective "I think my shoulder range of motion is pretty good." She is still having ooziing form the left side and has started developing some oozing from the right side this Wednesday morning.   She has no fever, there is not odor from drainage.    Pertinent History  Bilateral mastectomy 11/01/15 for right breast cancer; the left was prophylactic.  Right SLNB (2 nodes, both negative; also 2 in the breast tissue, both negative).  Surgeon (Dr. Donne Hazel) released her to come back to therapy.  Only other treatment may be hormonal therapy.  Therapy she had here and with massage therapist pre-op helped a lot.  No other health issues.   Patient Stated Goals "I want my 100% ROM back where I can garden, exercise, do yoga and tai chi--everything I did before."   Currently in Pain? No/denies                         [facility] Adult PT Treatment/Exercise - 12/02/15 0001    Balance Poses: Yoga   Tree Pose 2 reps  pt very familiar with standing yoga poses, can do them well   Self-Care   Other Self-Care Comments  issued skin cote to try under area of tape that are not irritated (she has no open irritation so far) to see if it will help with discomfort from tap    Lumbar Exercises: Supine   Other Supine Lumbar Exercises minimarching for core activation    Shoulder Exercises: Supine   External Rotation AAROM;Both;15 reps;Other (comment)  with elbow on  pillow    Shoulder Exercises: Standing   Other Standing Exercises Codmans exercise for glenohumeral movement    Manual Therapy   Manual Therapy Manual Lymphatic Drainage (MLD)   Manual Lymphatic Drainage (MLD) superficial abdominals, diphragmatic breathing, ingunal nodes, adominal area below rib cage to stimulate lymphatic flow to hopefully decrease oozing.                PT Education - 12/02/15 1056    Education provided Yes   Education Details beginning manual lymph drainage to abdomen    Person(s) Educated Patient   Methods Explanation;Demonstration   Comprehension Verbalized understanding;Returned demonstration           Short Term Clinic Goals - 10/27/15 1032    CC Short Term Goal  #3   Title Patient will be able to report she is able to use her right arm with >/= 25% less difficulty   80% improvement reported and not self limiting herself any longer with activities.    Status Achieved           Breast Clinic Goals - 09/28/15 1320    Patient will be able to verbalize understanding of pertinent lymphedema risk reduction practices relevant to her diagnosis specifically related to skin care.   Time 1   Period Days   Status Achieved   Patient will be able to return demonstrate and/or verbalize understanding of the post-op home exercise program related to regaining shoulder range of motion.   Time 1   Period Days   Status Achieved   Patient will be able to verbalize understanding of the importance of attending the postoperative After Breast Cancer Class for further lymphedema risk reduction education and therapeutic exercise.   Time 1   Period Days   Status Achieved          Long Term Clinic Goals - 11/28/15 1634    CC Long Term Goal  #4   Title Bilateral shoulder active flexion to at least 160 degrees for improved overhead reach.   Time 4   Period Weeks   Status New   CC Long Term Goal  #5   Title bilateral shoulder active abduction to at least 160 degrees for improved ADLs.   Time 4   Period Weeks   Status New   CC Long Term Goal  #6   Title independent with HEP for shoulder ROM and strengthening   Time 4   Period Weeks   Status New   Additional Goals   Additional Goals Yes            Plan - 12/02/15 1057    Clinical Impression Statement Pt contiinues to have oozing from both incisions but no tenderness redness odor or fever per her report.  Tried to avoid arm movement above 90 degrees for this session to  limit traction on incicions. She kept dressings and comapression bra in place so did not visually inspect incisions.  concentrated on core work and lymph stimulation at abdomen to assist with absorption of excess fluid.         Problem List Patient Active Problem List   Diagnosis Date Noted  . Breast cancer ([place]) 11/01/2015  . Genetic  testing 10/06/2015  . Breast cancer of upper-outer quadrant of right female breast ([place]) 09/23/2015  . Grave's disease    Donato Heinz. Owens Shark, PT  12/02/2015, 12:32 PM  [place], [place], [address] Phone: [phone]   Fax:  [phone]  Name: Amber Pugh MRN: [id number] Date of Birth: 30-Nov-1971

## 2015-12-12 ENCOUNTER — Ambulatory Visit: Payer: BLUE CROSS/BLUE SHIELD | Admitting: Physical Therapy

## 2015-12-14 ENCOUNTER — Ambulatory Visit: Payer: BLUE CROSS/BLUE SHIELD | Admitting: Physical Therapy

## 2015-12-14 DIAGNOSIS — M7582 Other shoulder lesions, left shoulder: Secondary | ICD-10-CM | POA: Diagnosis not present

## 2015-12-14 DIAGNOSIS — M25612 Stiffness of left shoulder, not elsewhere classified: Secondary | ICD-10-CM

## 2015-12-14 DIAGNOSIS — M25611 Stiffness of right shoulder, not elsewhere classified: Secondary | ICD-10-CM

## 2015-12-14 NOTE — Therapy (Signed)
[place], [place], [address] Phone: [phone]   Fax:  [phone]  Physical Therapy Treatment  Patient Details  Name: Amber Pugh MRN: [id number] Date of Birth: [birth year] Referring Provider: Dr. Rolm Bookbinder  Encounter Date: 12/14/2015      PT End of Session - 12/14/15 1235    Visit Number 13   Number of Visits 19   Date for PT Re-Evaluation 12/31/15   PT Start Time 1106   PT Stop Time 1203   PT Time Calculation (min) 57 min   Activity Tolerance Patient limited by pain   Behavior During Therapy [facility] for tasks assessed/performed      Past Medical History  Diagnosis Date  . Grave's disease     GRAVES DISEASE  . Migraines     without aura  . Abnormal Pap smear of cervix 6/02    +HPV HR, CIN1  . MVP (mitral valve prolapse)     neg cardio workup  . Melasma   . Basal cell carcinoma   . Breast cancer ([place])   . Graves disease   . Joint pain   . Graves disease 1999    she was treatmented with medications 3 times, in remission for the past 3 years ago  . Anxiety     Past Surgical History  Procedure Laterality Date  . Cryotherapy  3/01    CIN1  . Wisdom tooth extraction    . Mastectomy w/ sentinel node biopsy Bilateral 11/01/2015    Procedure: BILATERAL TOTAL MASTECTOMY WITH RIGHT SENTINEL LYMPH NODE BIOPSY;  Surgeon: Rolm Bookbinder, MD;  Location: [place];  Service: General;  Laterality: Bilateral;    There were no vitals filed for this visit.  Visit Diagnosis:  Decreased ROM of left shoulder  Decreased ROM of right shoulder      Subjective Assessment - 12/14/15 1109    Subjective A whole lot is new.  Last time I was still seeping out of the left and then I started seeping out of the right.  Saw Dr. Donne Hazel a second time yesterday; he took off all the steri strips and said the skinhad dies under a esction of that.  Pt. is to ue Neosporin and  bandages on that.  Dr. Donne Hazel said not to hold back on exercise.  Pt. started back on exercises yesterday and noticed a decrease in ROM. Did 30 minutes on the rebounder this morning.         Currently in Pain? No/denies   Pain Score --  up to 8/10   Pain Location Chest  and anterior shoulder   Pain Orientation Right   Pain Descriptors / Indicators Aching   Pain Frequency Intermittent   Aggravating Factors  unpredictable   Pain Relieving Factors tylenol            [facility] PT Assessment - 12/14/15 0001    AROM   Right Shoulder Flexion 116 Degrees   Right Shoulder ABduction 100 Degrees   Left Shoulder Flexion 120 Degrees   Left Shoulder ABduction 115 Degrees                     [facility] Adult PT Treatment/Exercise - 12/14/15 0001    Shoulder Exercises: Supine   Other Supine Exercises supine hooklying:  lower trunk rotation with opposite side arm in some abduction for upper chest/anterior shoulder stretches, both sides, with therapist's manual guidance   Other Supine Exercises supine over towel  roll, relaxing shoulders back.   Manual Therapy   Passive ROM In supine, right and left shoulder to patient tolerance for ER, abduction, and flexion; on left side, pt. had slightly better tolerance when distraction force was placed on arm while doing PROM.                   Short Term Clinic Goals - 10/27/15 1032    CC Short Term Goal  #3   Title Patient will be able to report she is able to use her right arm with >/= 25% less difficulty  80% improvement reported and not self limiting herself any longer with activities.    Status Achieved           Breast Clinic Goals - 09/28/15 1320    Patient will be able to verbalize understanding of pertinent lymphedema risk reduction practices relevant to her diagnosis specifically related to skin care.   Time 1   Period Days   Status Achieved   Patient will be able to return demonstrate and/or verbalize understanding of  the post-op home exercise program related to regaining shoulder range of motion.   Time 1   Period Days   Status Achieved   Patient will be able to verbalize understanding of the importance of attending the postoperative After Breast Cancer Class for further lymphedema risk reduction education and therapeutic exercise.   Time 1   Period Days   Status Achieved          Long Term Clinic Goals - 11/28/15 1634    CC Long Term Goal  #4   Title Bilateral shoulder active flexion to at least 160 degrees for improved overhead reach.   Time 4   Period Weeks   Status New   CC Long Term Goal  #5   Title bilateral shoulder active abduction to at least 160 degrees for improved ADLs.   Time 4   Period Weeks   Status New   CC Long Term Goal  #6   Title independent with HEP for shoulder ROM and strengthening   Time 4   Period Weeks   Status New   Additional Goals   Additional Goals Yes            Plan - 12/14/15 1237    Clinical Impression Statement Patient saw Dr. Donne Hazel, who wants her to go ahead without restrictions in the use of her arms.  Today she tolerated slow, gentle PROM but was limited by pain, particularly on the left side.  She felt good when lying supine over a towel roll for gentle stretch to the chest.  Patient asked about how long she might need therapy, so we discussed this, but she was given a ballpark figure of 2 months.   Pt will benefit from skilled therapeutic intervention in order to improve on the following deficits Decreased range of motion;Impaired UE functional use;Decreased strength;Pain   Rehab Potential Excellent   PT Frequency 2x / week   PT Duration 4 weeks   PT Treatment/Interventions Passive range of motion;Manual techniques;Therapeutic exercise   PT Next Visit Plan Continue P/AA/AROM with manual techniques; modify shoulder ROM HEP as needed.  When healed, begin soft tissue mobilization and myofascial release to chest.   PT Home Exercise Plan  Continue prior exercise program   Consulted and Agree with Plan of Care Patient        Problem List Patient Active Problem List   Diagnosis Date Noted  . Breast cancer (  [place]) 11/01/2015  . Genetic testing 10/06/2015  . Breast cancer of upper-outer quadrant of right female breast ([place]) 09/23/2015  . Grave's disease     SALISBURY,DONNA 12/14/2015, 12:43 PM  [place], [place], [address] Phone: [phone]   Fax:  [phone]  Name: Amber Pugh MRN: [id number] Date of Birth: [birth year]    Serafina Royals, PT 12/14/2015 12:43 PM

## 2015-12-15 ENCOUNTER — Ambulatory Visit (HOSPITAL_BASED_OUTPATIENT_CLINIC_OR_DEPARTMENT_OTHER): Payer: BLUE CROSS/BLUE SHIELD | Admitting: Hematology

## 2015-12-15 ENCOUNTER — Encounter: Payer: Self-pay | Admitting: Hematology

## 2015-12-15 ENCOUNTER — Other Ambulatory Visit (HOSPITAL_BASED_OUTPATIENT_CLINIC_OR_DEPARTMENT_OTHER): Payer: BLUE CROSS/BLUE SHIELD

## 2015-12-15 ENCOUNTER — Telehealth: Payer: Self-pay | Admitting: Hematology

## 2015-12-15 VITALS — BP 116/75 | HR 56 | Temp 98.3°F | Resp 18 | Ht 66.0 in | Wt 148.2 lb

## 2015-12-15 DIAGNOSIS — C50811 Malignant neoplasm of overlapping sites of right female breast: Secondary | ICD-10-CM

## 2015-12-15 DIAGNOSIS — Z170 Estrogen receptor positive status [ER+]: Secondary | ICD-10-CM

## 2015-12-15 DIAGNOSIS — C50411 Malignant neoplasm of upper-outer quadrant of right female breast: Secondary | ICD-10-CM

## 2015-12-15 LAB — COMPREHENSIVE METABOLIC PANEL
ALBUMIN: 4.3 g/dL (ref 3.5–5.0)
ALK PHOS: 49 U/L (ref 40–150)
ALT: 15 U/L (ref 0–55)
AST: 15 U/L (ref 5–34)
Anion Gap: 9 mEq/L (ref 3–11)
BILIRUBIN TOTAL: 0.69 mg/dL (ref 0.20–1.20)
BUN: 11.3 mg/dL (ref 7.0–26.0)
CO2: 28 mEq/L (ref 22–29)
CREATININE: 0.8 mg/dL (ref 0.6–1.1)
Calcium: 9.3 mg/dL (ref 8.4–10.4)
Chloride: 101 mEq/L (ref 98–109)
GLUCOSE: 85 mg/dL (ref 70–140)
Potassium: 3.7 mEq/L (ref 3.5–5.1)
Sodium: 137 mEq/L (ref 136–145)
TOTAL PROTEIN: 7.8 g/dL (ref 6.4–8.3)

## 2015-12-15 LAB — CBC WITH DIFFERENTIAL/PLATELET
BASO%: 0.9 % (ref 0.0–2.0)
Basophils Absolute: 0 10*3/uL (ref 0.0–0.1)
EOS ABS: 0.1 10*3/uL (ref 0.0–0.5)
EOS%: 1.5 % (ref 0.0–7.0)
HCT: 40.8 % (ref 34.8–46.6)
HEMOGLOBIN: 13.4 g/dL (ref 11.6–15.9)
LYMPH%: 32.5 % (ref 14.0–49.7)
MCH: 30.2 pg (ref 25.1–34.0)
MCHC: 32.8 g/dL (ref 31.5–36.0)
MCV: 92.2 fL (ref 79.5–101.0)
MONO#: 0.4 10*3/uL (ref 0.1–0.9)
MONO%: 7.5 % (ref 0.0–14.0)
NEUT%: 57.6 % (ref 38.4–76.8)
NEUTROS ABS: 3 10*3/uL (ref 1.5–6.5)
Platelets: 230 10*3/uL (ref 145–400)
RBC: 4.42 10*6/uL (ref 3.70–5.45)
RDW: 12.4 % (ref 11.2–14.5)
WBC: 5.2 10*3/uL (ref 3.9–10.3)
lymph#: 1.7 10*3/uL (ref 0.9–3.3)

## 2015-12-15 NOTE — Telephone Encounter (Signed)
per pof to sch pt appt-gave pt copy of avs °

## 2015-12-15 NOTE — Progress Notes (Signed)
[facility]  [phone] [phone]  Clinic Follow Up Note   Patient Care Team: Carlos Levering, PA-C as PCP - General (Family Medicine) Rolm Bookbinder, MD as Consulting Physician (General Surgery) Truitt Merle, MD as Consulting Physician (Hematology) Thea Silversmith, MD as Consulting Physician (Radiation Oncology) Mauro Kaufmann, RN as Registered Nurse Rockwell Germany, RN as Registered Nurse Sylvan Cheese, NP as Nurse Practitioner (Nurse Practitioner) 12/15/2015  CHIEF COMPLAINTS:  Follow Up right breast cancer    Oncology History   Breast cancer of upper-outer quadrant of right female breast [facility])   Staging form: Breast, AJCC 7th Edition     Clinical stage from 09/20/2015: Stage IA (T1c, N0, M0) - Unsigned     Pathologic stage from 11/04/2015: Stage IIA (T2(m), N0, cM0) - Signed by Enid Cutter, MD on 11/21/2015       Staging comments: Staged on final surgical specimen by Dr. Saralyn Pilar.        Breast cancer of upper-outer quadrant of right female breast ([place])   09/20/2015 Initial Biopsy Right breast core needle biopsy 12:00 position showed invasive lobular carcinoma, second biopsy at 9:00 showed similar invasive lobular carcinoma, grade 1.   09/20/2015 Receptors her2 ER 70% positive, PR 90% positive, HER-2 negative, Ki-67 20%   09/20/2015 Initial Diagnosis Breast cancer of upper-outer quadrant of right female breast   09/26/2015 Imaging breast MRI confirmed biopsy-proven right breast 2 cm lesion at 12:00, and 1.3 cm lesion at 9:00 clock, and additional 0.9 cm non-mass enhancement at 6:00 position of the right breast, and oral enhancing mass at the 7:00 of the left breast measuring 0.7 cm   11/01/2015 Surgery Bilateral mastectomy with right side sentinel lymph node biopsy. No reconstruction. Surgical margins were negative.   11/01/2015 Pathology Results Right breast mastectomy showed 2 foci invasive lobular carcinoma, 3.0 cm and 2.0 cm,  grade 2, margins were negative, a total of 4 lymph nodes were negative. No lymphovascular invasion,    HISTORY OF PRESENTING ILLNESS:  Amber Pugh 44 y.o. female is here because of her recently diagnosed right breast cancer. She is accompanied by her parents in low to our multidisciplinary breast clinic today.  She went to see her GYN nurse practitioner a few weeks ago for routine follow-up, breast exam reviewed a palpable right breast mass at 12:00 position. Patient does not feel the mass before her visit. This prompted a mammogram, which was her first mammogram in her life, on 09/19/2015. Which showed a 2.5 cm irregular hyperdense mass in the right breast 12:00 position, and a 8 mm mass at night clock position, which is suspicious for malignancy. She underwent ultrasound-guided needle biopsy of post the 12:00 and 9 clock position mass, both showed invasive lobular carcinoma. She was underwent breast MRI on 09/26/2015, which confirmed the biopsy-proven right breast cancer and 12 and 9 clock position, an additional a non-mass enhancement at the 6:00 position of right breast, and an oval enhancing mass at 7:00 of the left breast measuring 0.7 cm. The second look ultrasound of the left breast was negative for masses.  Her past medical history was positive for Graves' disease, which she was treated with medications and has been in remission for the past 3 years. She is very active, a true believer of healthy diet and herb supplement and nutritional supplement. Her husband was diagnosed with stage II colon cancer, and is currently undergoing adjuvant chemotherapy under my care. I've known her since 3 months  ago.  CURRENT THERAPY: None   INTERIM HISTORY: Joellen Jersey returns for follow-up. She underwent bilateral mastectomy on 11/01/2015, no reconstruction. She tolerated surgery well, has been recovering well. She has been off pain medication, has good appetite and energy level. No other new complains.    MEDICAL HISTORY:  Past Medical History  Diagnosis Date  . Grave's disease     GRAVES DISEASE  . Migraines     without aura  . Abnormal Pap smear of cervix 6/02    +HPV HR, CIN1  . MVP (mitral valve prolapse)     neg cardio workup  . Melasma   . Basal cell carcinoma   . Breast cancer ([place])   . Graves disease   . Joint pain   . Graves disease 1999    she was treatmented with medications 3 times, in remission for the past 3 years ago  . Anxiety     SURGICAL HISTORY: Past Surgical History  Procedure Laterality Date  . Cryotherapy  3/01    CIN1  . Wisdom tooth extraction    . Mastectomy w/ sentinel node biopsy Bilateral 11/01/2015    Procedure: BILATERAL TOTAL MASTECTOMY WITH RIGHT SENTINEL LYMPH NODE BIOPSY;  Surgeon: Rolm Bookbinder, MD;  Location: [facility];  Service: General;  Laterality: Bilateral;   GYN HISTORY  Menarchal: 15 LMP: 09/07/2015 Contraceptive: no HRT: no G0P0:   SOCIAL HISTORY: Social History   Social History  . Marital Status: Married    Spouse Name: N/A  . Number of Children: N/A  . Years of Education: N/A   Occupational History  . Not on file.   Social History Main Topics  . Smoking status: Never Smoker   . Smokeless tobacco: Not on file  . Alcohol Use: No  . Drug Use: No  . Sexual Activity:    Partners: Male     Comment: withdrawal   Other Topics Concern  . Not on file   Social History Narrative    FAMILY HISTORY: Family History  Problem Relation Age of Onset  . Hypertension Father   . Diabetes Father   . Skin cancer Father     unknown type  . Gout Brother   . Hypertension Brother   . Pulmonary embolism Maternal Grandfather   . Diabetes Maternal Grandfather   . Prostate cancer Other     MGMs brother dx in his 24s-80s    ALLERGIES:  is allergic to cefdinir and tetracyclines & related.  MEDICATIONS:  Current Outpatient Prescriptions  Medication Sig Dispense Refill  . acetaminophen (TYLENOL) 325 MG  tablet Take 650 mg by mouth every 6 (six) hours as needed.    . magnesium gluconate (MAGONATE) 500 MG tablet Take 500 mg by mouth 3 (three) times daily.    . Probiotic Product (PROBIOTIC DAILY PO) Take 1 tablet by mouth daily. 25 million colonies    . Zinc 30 MG CAPS Take 1 capsule by mouth daily. With Biotin    . CALCIUM PO Take by mouth as needed.    . Multiple Vitamins-Minerals (MULTIVITAMIN PO) Take by mouth daily.    Jonna Coup Leaf Extract 500 MG CAPS Take 1 capsule by mouth 2 (two) times daily.    . Turmeric 500 MG CAPS Take by mouth.    Marland Kitchen UNABLE TO FIND Rosemary , Tumeric, ginger  Two to Three times/day     No current facility-administered medications for this visit.    REVIEW OF SYSTEMS:   Constitutional: Denies fevers, chills or  abnormal night sweats Eyes: Denies blurriness of vision, double vision or watery eyes Ears, nose, mouth, throat, and face: Denies mucositis or sore throat Respiratory: Denies cough, dyspnea or wheezes Cardiovascular: Denies palpitation, chest discomfort or lower extremity swelling Gastrointestinal:  Denies nausea, heartburn or change in bowel habits Skin: Denies abnormal skin rashes Lymphatics: Denies new lymphadenopathy or easy bruising Neurological:Denies numbness, tingling or new weaknesses Behavioral/Psych: Mood is stable, no new changes  All other systems were reviewed with the patient and are negative.  PHYSICAL EXAMINATION: ECOG PERFORMANCE STATUS: 0 - Asymptomatic  Filed Vitals:   12/15/15 1414  BP: 116/75  Pulse: 56  Temp: 98.3 F (36.8 C)  Resp: 18   Filed Weights   12/15/15 1414  Weight: 148 lb 3.2 oz (67.223 kg)    GENERAL:alert, no distress and comfortable SKIN: skin color, texture, turgor are normal, no rashes or significant lesions EYES: normal, conjunctiva are pink and non-injected, sclera clear OROPHARYNX:no exudate, no erythema and lips, buccal mucosa, and tongue normal  NECK: supple, thyroid normal size, non-tender,  without nodularity LYMPH:  no palpable lymphadenopathy in the cervical, axillary or inguinal LUNGS: clear to auscultation and percussion with normal breathing effort HEART: regular rate & rhythm and no murmurs and no lower extremity edema ABDOMEN:abdomen soft, non-tender and normal bowel sounds Musculoskeletal:no cyanosis of digits and no clubbing  PSYCH: alert & oriented x 3 with fluent speech NEURO: no focal motor/sensory deficits  LABORATORY DATA:  I have reviewed the data as listed Lab Results  Component Value Date   WBC 5.2 12/15/2015   HGB 13.4 12/15/2015   HCT 40.8 12/15/2015   MCV 92.2 12/15/2015   PLT 230 12/15/2015    Recent Labs  09/28/15 0818 10/27/15 1300 12/15/15 1347  NA 138 141 137  K 3.9 3.6 3.7  CL  --  101  --   CO2 23 27 28   GLUCOSE 101 87 85  BUN 11.6 12 11.3  CREATININE 0.9 0.76 0.8  CALCIUM 9.1 9.8 9.3  GFRNONAA  --  >60  --   GFRAA  --  >60  --   PROT 7.5  --  7.8  ALBUMIN 4.1  --  4.3  AST 15  --  15  ALT 15  --  15  ALKPHOS 43  --  49  BILITOT 0.61  --  0.69    PATHOLOGY REPORT  Diagnosis 11/01/2015 1. Breast, simple mastectomy, Left - FIBROCYSTIC CHANGES. - PSEUDOANGIOMATOUS STROMAL HYPERPLASIA ([place]). - BIOPSY CAVITY. - NO EVIDENCE OF MALIGNANCY. 2. Breast, simple mastectomy, Right - INVASIVE LOBULAR CARCINOMA, TWO FOCI, 3.0 CM AND 2.0 CM. - MARGINS NOT INVOLVED. - TWO BENIGN LYMPH NODES (0/2). - FIBROCYSTIC CHANGES AND FIBROADENOMA. - SEE ONCOLOGY TABLE BELOW. 3. Lymph node, sentinel, biopsy, Right axillary - ONE BENIGN LYMPH NODE (0/1). 4. Lymph node, sentinel, biopsy, Right axillary - ONE BENIGN LYMPH NODE (0/1). Microscopic Comment 2. BREAST, INVASIVE TUMOR, WITH LYMPH NODES PRESENT Specimen, including laterality and lymph node sampling (sentinel, non-sentinel): Right breast with two sentinel lymph nodes and two non-sentinel lymph nodes. Procedure: Mastectomy and lymph node sampling. Histologic type: Lobular. Grade:  2. Tubule formation: 3. Nuclear pleomorphism: 2. Mitotic: 2. Tumor size (gross measurement: 3.0 cm and 2.0 cm. Margins: Free of tumor. Invasive, distance to closest margin: 1.5 cm from posterior margin. In-situ, distance to closest margin: N/A. If margin positive, focally or broadly: N/A. Lymphovascular invasion: Not identified. Ductal carcinoma in situ: No. Grade: N/A. Extensive intraductal component: N/A. Tumor focality: Two  foci of tumor. Treatment effect: No. If present, treatment effect in breast tissue, lymph nodes or both: N/A. Extent of tumor: Skin: Free of tumor. Nipple: Free of tumor. Skeletal muscle: N/A. Lymph nodes: Examined: 2 Sentinel. 2 Non-sentinel 4 Total Lymph nodes with metastasis: 0. Isolated tumor cells (< 0.2 mm): 0. Micrometastasis: (> 0.2 mm and < 2.0 mm): 0. 2 of 5 FINAL for KARLY, PITTER ([id number]) Microscopic Comment(continued) Macrometastasis: (> 2.0 mm): 0. Extracapsular extension: N/A. Breast prognostic profile: Case # [id number]. Estrogen receptor: 70%, strong staining. Progesterone receptor: 90%, strong staining. Her 2 neu: Negative, ratio 1.29. Will be repeated on current specimen. Ki-67: 20%. Non-neoplastic breast: Fibrocystic changes and fibroadenoma. TNM: pT2 (multifocal), pN0, pMX. Comments: There are two tumors which are 3.0 and 2.0 cm in greatest dimension, and are each located 1.5 cm from the nearest posterior margin. Immunohistochemistry for E-Cadherin is performed and the tumor is negative consistent with lobular differentiation. Immunohistochemistry with cytokeratin AE1/AAE3 is negative in all of the lymph nodes. (JDP:ds 11/03/15) JOHN  ADDITIONAL INFORMATION: 2. C. FLUORESCENCE IN-SITU HYBRIDIZATION (BLOCK 2C) Results: HER2 - NEGATIVE RATIO OF HER2/CEP17 SIGNALS 1.07 AVERAGE HER2 COPY NUMBER PER CELL 1.55 Reference Range: NEGATIVE HER2/CEP17 Ratio <2.0 and average HER2 copy number <4.0 EQUIVOCAL HER2/CEP17 Ratio  <2.0 and average HER2 copy number >=4.0 and <6.0 POSITIVE HER2/CEP17 Ratio >=2.0 or <2.0 and average HER2 copy number >=6.0 G. FLUORESCENCE IN-SITU HYBRIDIZATION Results: HER2 - NEGATIVE RATIO OF HER2/CEP17 SIGNALS 1.00 AVERAGE HER2 COPY NUMBER PER CELL 1.65 Reference  Oncotype DX: Recurrence score 13, low risk, which predicts 10 year risk of distant recurrence 9% with tamoxifen alone.  RADIOGRAPHIC STUDIES: I have personally reviewed the radiological images as listed and agreed with the findings in the report.  No new scans    ASSESSMENT & PLAN: 44 year old premenopausal Caucasian female  1. Multifocal right breast invasive lobular carcinoma, mpT2N0M0, stage II, G2, ER+/PR+/HER2- -I reviewed her surgical pathology findings with her in great details. She has multifocal (2) stage II lobular carcinoma, surgical margins were negative. -I reviewed her Oncotype DX test result. The recurrence score is 13, which predicts 9% 10 year risk of distant recurrence with tamoxifen alone, it is considered as low risk. So I do not recommend adjuvant chemotherapy. --Giving her strongly ER and PR positivity of the tumor cells, and her pre-menopause status, I recommend adjuvant endocrine therapy with tamoxifen. The potential side effects, which includes but not limited to, hot flash, skin and vaginal dryness, slightly increased risk of cardiovascular disease and cataract, small risk of thrombosis and endometrial cancer, were discussed with her in great details. Preventive strategies for thrombosis, such as being physically active, using compression stocks, avoid cigarette smoking, etc., were reviewed with her. I also recommend her to follow-up with her gynecologist once a year, and watch for vaginal spotting or bleeding, as a clinically sign of endometrial cancer, etc.  -Alternative adjuvant endocrine therapy, such as aromatase inhibitor and ovarian suppression were discussed with her also. She has decided not to  have children in the future. Given her very young young age and stage II disease, strongly hormonal positive disease, she may benefit more from this therapy then tamoxifen alone. I recommend her to consider it. Potential side effects were discussed with her. -She is a very knowledgeable about the above hormonal therapy, has done a lot of reading and research. She is very concerned about the potential side effects, based on her previous body reaction to medicines. She will think about it and  decide in the next few months. -We also reviewed the breast cancer surveillance, including annual mammogram, and physical exam.  -She is a strong believer of healthy diet, nutritional supplement, and is very physically active. -She does not need post mastectomy adjuvant radiation. -Her husband is currently undergoing chemoradiation for his colorectal cancer under my care, patient is his primary caregiver. She will likely start endocrine therapy in 2 months when her husband recovers better, if she decides to take it.  2. Grave's disease -She'll continue follow-up with her primary care physician, she is in the process to find a new endocrinologist.  3. Genetics -Negative genetic testing on the Breast/Ovarian cancer panel.  The Breast/Ovarian gene panel offered by [facility] includes sequencing and rearrangement analysis for the following 20 genes:  ATM, BARD1, BRCA1, BRCA2, BRIP1, CDH1, CHEK2, EPCAM, FANCC, MLH1, MSH2, MSH6, NBN, PALB2, PMS2, PTEN, RAD51C, RAD51D, TP53, and XRCC2  Plan -She will think about adjuvant endocrine therapy, tamoxifen versus ovarian suppression and aromatase inhibitor -RTC in 2 month for follow up   All questions were answered. The patient knows to call the clinic with any problems, questions or concerns. I spent 30 minutes counseling the patient face to face. The total time spent in the appointment was 40 minutes and more than 50% was on counseling.     Truitt Merle, MD 12/15/2015 8:35  PM

## 2015-12-19 ENCOUNTER — Ambulatory Visit: Payer: BLUE CROSS/BLUE SHIELD | Admitting: Physical Therapy

## 2015-12-19 DIAGNOSIS — M25611 Stiffness of right shoulder, not elsewhere classified: Secondary | ICD-10-CM

## 2015-12-19 DIAGNOSIS — M5382 Other specified dorsopathies, cervical region: Secondary | ICD-10-CM

## 2015-12-19 DIAGNOSIS — M7582 Other shoulder lesions, left shoulder: Secondary | ICD-10-CM | POA: Diagnosis not present

## 2015-12-19 DIAGNOSIS — M25612 Stiffness of left shoulder, not elsewhere classified: Secondary | ICD-10-CM

## 2015-12-19 DIAGNOSIS — M629 Disorder of muscle, unspecified: Secondary | ICD-10-CM

## 2015-12-19 NOTE — Therapy (Signed)
Amber Pugh, [place], [address] Phone: [phone]   Fax:  [phone]  Physical Therapy Treatment  Patient Details  Name: Amber Pugh MRN: [id number] Date of Birth: August 11, 1971 Referring Provider: Dr. Rolm Bookbinder  Encounter Date: 12/19/2015      PT End of Session - 12/19/15 1316    Visit Number 14   Number of Visits 19   Date for PT Re-Evaluation 12/31/15   PT Start Time 0936   PT Stop Time 1018   PT Time Calculation (min) 42 min   Activity Tolerance Patient limited by pain   Behavior During Therapy [facility] for tasks assessed/performed      Past Medical History  Diagnosis Date  . Grave's disease     GRAVES DISEASE  . Migraines     without aura  . Abnormal Pap smear of cervix 6/02    +HPV HR, CIN1  . MVP (mitral valve prolapse)     neg cardio workup  . Melasma   . Basal cell carcinoma   . Breast cancer ([place])   . Graves disease   . Joint pain   . Graves disease 1999    she was treatmented with medications 3 times, in remission for the past 3 years ago  . Anxiety     Past Surgical History  Procedure Laterality Date  . Cryotherapy  3/01    CIN1  . Wisdom tooth extraction    . Mastectomy w/ sentinel node biopsy Bilateral 11/01/2015    Procedure: BILATERAL TOTAL MASTECTOMY WITH RIGHT SENTINEL LYMPH NODE BIOPSY;  Surgeon: Rolm Bookbinder, MD;  Location: [place];  Service: General;  Laterality: Bilateral;    There were no vitals filed for this visit.  Visit Diagnosis:  Decreased ROM of left shoulder  Decreased ROM of right shoulder  Musculoskeletal disorder involving upper trapezius muscle      Subjective Assessment - 12/19/15 0937    Subjective Yesterday, couldn't raise arms up over shoulder height; today able to walk up the wall a good distance above shoulder.  Drove to [place] two days ago and had tightness after that.  Says she has had more skin die  off on right side; seapage has decreased.  Felt great when she left here last time.  Has been on the rebounder 30 minutes a day.   Currently in Pain? Yes   Pain Score 7    Pain Location Chest   Pain Orientation Right;Left   Pain Descriptors / Indicators Other (Comment);Tightness;Discomfort  "ratcheting"   Aggravating Factors  stretching                         [facility] Adult PT Treatment/Exercise - 12/19/15 0001    Manual Therapy   Myofascial Release In supine, O-A release, then neck manual traction with gentle distraction on sternum.   Passive ROM In supine, right shoulder PROM in ER, abduction, and flexion--limited by pain today more than last time; right UE neural tension stretch; left shoulder PROM to tolerance.  pect minor stretch.  Supine over towel roll with arms down at sides, then with arms in partial abduction and patient working to increase how much abduction.                   Short Term Clinic Goals - 10/27/15 1032    CC Short Term Goal  #3   Title Patient will be able to report she is able  to use her right arm with >/= 25% less difficulty  80% improvement reported and not self limiting herself any longer with activities.    Status Achieved           Breast Clinic Goals - 09/28/15 1320    Patient will be able to verbalize understanding of pertinent lymphedema risk reduction practices relevant to her diagnosis specifically related to skin care.   Time 1   Period Days   Status Achieved   Patient will be able to return demonstrate and/or verbalize understanding of the post-op home exercise program related to regaining shoulder range of motion.   Time 1   Period Days   Status Achieved   Patient will be able to verbalize understanding of the importance of attending the postoperative After Breast Cancer Class for further lymphedema risk reduction education and therapeutic exercise.   Time 1   Period Days   Status Achieved          Long  Term Clinic Goals - 12/19/15 1318    CC Long Term Goal  #4   Title Bilateral shoulder active flexion to at least 160 degrees for improved overhead reach.   Status On-going   CC Long Term Goal  #5   Title bilateral shoulder active abduction to at least 160 degrees for improved ADLs.   Status On-going   CC Long Term Goal  #6   Title independent with HEP for shoulder ROM and strengthening   Status On-going            Plan - 12/19/15 1316    Clinical Impression Statement Patient with more pain causing decresed tolerance for shoulder PROM today, especially on the right.     Pt will benefit from skilled therapeutic intervention in order to improve on the following deficits Decreased range of motion;Impaired UE functional use;Decreased strength;Pain   Rehab Potential Excellent   Clinical Impairments Affecting Rehab Potential still healing from surgery 11/01/15 as of re-eval on 11/28/15   PT Frequency 2x / week   PT Duration 4 weeks   PT Treatment/Interventions Passive range of motion;Manual techniques;Therapeutic exercise   PT Next Visit Plan Continue P/AA/AROM with manual techniques; modify shoulder ROM HEP as needed.  When healed, begin soft tissue mobilization and myofascial release to chest.   Consulted and Agree with Plan of Care Patient        Problem List Patient Active Problem List   Diagnosis Date Noted  . Genetic testing 10/06/2015  . Breast cancer of upper-outer quadrant of right female breast ([place]) 09/23/2015  . Grave's disease     SALISBURY,DONNA 12/19/2015, 1:20 PM  [place], [place], [address] Phone: [phone]   Fax:  [phone]  Name: Amber Pugh MRN: [id number] Date of Birth: 1971/11/14    Serafina Royals, PT 12/19/2015 1:20 PM

## 2015-12-21 ENCOUNTER — Ambulatory Visit: Payer: BLUE CROSS/BLUE SHIELD | Admitting: Physical Therapy

## 2015-12-21 DIAGNOSIS — M7582 Other shoulder lesions, left shoulder: Secondary | ICD-10-CM | POA: Diagnosis not present

## 2015-12-21 DIAGNOSIS — M5382 Other specified dorsopathies, cervical region: Secondary | ICD-10-CM

## 2015-12-21 DIAGNOSIS — M25612 Stiffness of left shoulder, not elsewhere classified: Secondary | ICD-10-CM

## 2015-12-21 DIAGNOSIS — M629 Disorder of muscle, unspecified: Secondary | ICD-10-CM

## 2015-12-21 DIAGNOSIS — M25611 Stiffness of right shoulder, not elsewhere classified: Secondary | ICD-10-CM

## 2015-12-21 NOTE — Therapy (Addendum)
[place], [place], [address] Phone: [phone]   Fax:  [phone]  Physical Therapy Treatment  Patient Details  Name: Amber Pugh MRN: [id number] Date of Birth: [birth year] Referring Provider: Dr. Rolm Bookbinder  Encounter Date: 12/21/2015      PT End of Session - 12/21/15 0935    Visit Number 15   Number of Visits 19   Date for PT Re-Evaluation 12/31/15   PT Start Time 0845   PT Stop Time 0932   PT Time Calculation (min) 47 min   Activity Tolerance Patient tolerated treatment well;Patient limited by pain   Behavior During Therapy [facility] for tasks assessed/performed      Past Medical History  Diagnosis Date  . Grave's disease     GRAVES DISEASE  . Migraines     without aura  . Abnormal Pap smear of cervix 6/02    +HPV HR, CIN1  . MVP (mitral valve prolapse)     neg cardio workup  . Melasma   . Basal cell carcinoma   . Breast cancer ([place])   . Graves disease   . Joint pain   . Graves disease 1999    she was treatmented with medications 3 times, in remission for the past 3 years ago  . Anxiety     Past Surgical History  Procedure Laterality Date  . Cryotherapy  3/01    CIN1  . Wisdom tooth extraction    . Mastectomy w/ sentinel node biopsy Bilateral 11/01/2015    Procedure: BILATERAL TOTAL MASTECTOMY WITH RIGHT SENTINEL LYMPH NODE BIOPSY;  Surgeon: Rolm Bookbinder, MD;  Location: [facility];  Service: General;  Laterality: Bilateral;    There were no vitals filed for this visit.  Visit Diagnosis:  Decreased ROM of left shoulder  Decreased ROM of right shoulder  Musculoskeletal disorder involving upper trapezius muscle      Subjective Assessment - 12/21/15 0848    Subjective "I spent the day driving around in the car all day yesterday; things weren't as tight as over the weekend."  Did the biofeedback, but I liked the biofeedback I did previously  better.     Currently in Pain? Yes   Pain Score 5    Pain Location Chest   Pain Orientation Right;Upper   Pain Frequency Intermittent            [facility] PT Assessment - 12/21/15 0001    AROM   Right Shoulder Flexion 125 Degrees   Right Shoulder ABduction 104 Degrees   Left Shoulder Flexion 129 Degrees   Left Shoulder ABduction 118 Degrees                     [facility] Adult PT Treatment/Exercise - 12/21/15 0001    Manual Therapy   Manual Therapy Other (comment)   Manual therapy comments     Soft tissue mobilization scar mobilization, gently, at lateral aspect of left mastectomy scar.    Myofascial Release In supine with arm in partial abduction, at right axilla.; same on left   Scapular Mobilization in left sidelying, for right shoulder in various directions for protraction and depression; in sitting, left scapular mobilization   Passive ROM In supine right shoulder for ER, abduction and flexion to tolerance; same on left side   Other Manual Therapy trigger point release at right posterior shoulder;  Short Term Clinic Goals - 10/27/15 1032    CC Short Term Goal  #3   Title Patient will be able to report she is able to use her right arm with >/= 25% less difficulty  80% improvement reported and not self limiting herself any longer with activities.    Status Achieved           Breast Clinic Goals - 09/28/15 1320    Patient will be able to verbalize understanding of pertinent lymphedema risk reduction practices relevant to her diagnosis specifically related to skin care.   Time 1   Period Days   Status Achieved   Patient will be able to return demonstrate and/or verbalize understanding of the post-op home exercise program related to regaining shoulder range of motion.   Time 1   Period Days   Status Achieved   Patient will be able to verbalize understanding of the importance of attending the postoperative After Breast Cancer Class  for further lymphedema risk reduction education and therapeutic exercise.   Time 1   Period Days   Status Achieved          Long Term Clinic Goals - 12/21/15 2992    CC Long Term Goal  #4   Title Bilateral shoulder active flexion to at least 160 degrees for improved overhead reach.   Status On-going   CC Long Term Goal  #5   Title bilateral shoulder active abduction to at least 160 degrees for improved ADLs.   Status On-going   CC Long Term Goal  #6   Title independent with HEP for shoulder ROM and strengthening   Status On-going            Plan - 12/21/15 0936    Clinical Impression Statement Patient did better today; still limited by pain, but was able to do more today.  She was unsure she could tolerate left sidelying, but she did, and right scapular mobilization in this position was beneficial and felt good.  Bilat. shoulder active flexion and abduction have improved again slightly this week.   Pt will benefit from skilled therapeutic intervention in order to improve on the following deficits Decreased range of motion;Impaired UE functional use;Decreased strength;Pain   Rehab Potential Excellent   Clinical Impairments Affecting Rehab Potential still healing from surgery 11/01/15 as of re-eval on 11/28/15   PT Frequency 2x / week   PT Duration 4 weeks   PT Treatment/Interventions Passive range of motion;Manual techniques;Therapeutic exercise   PT Next Visit Plan Continue P/AA/AROM with manual techniques; modify shoulder ROM HEP as needed.  When healed, begin soft tissue mobilization and myofascial release to chest.   PT Home Exercise Plan Continue prior exercise program   Consulted and Agree with Plan of Care Patient        Problem List Patient Active Problem List   Diagnosis Date Noted  . Genetic testing 10/06/2015  . Breast cancer of upper-outer quadrant of right female breast ([place]) 09/23/2015  . Grave's disease     Darina Hartwell 12/21/2015, 9:39 AM  [place], [place], [address] Phone: [phone]   Fax:  [phone]  Name: Amber Pugh MRN: [id number] Date of Birth: 1971/03/24    Serafina Royals, PT 12/21/2015 9:39 AM  PHYSICAL THERAPY DISCHARGE SUMMARY  Visits from Start of Care: 15  Current functional level related to goals / functional outcomes: unknown   Remaining deficits: unknown   Education / Equipment: As above  Plan: Patient agrees to discharge.  Patient goals were partially met. Patient is being discharged due to not returning since the last visit.  ?????    Maudry Diego, PT 03/06/18 10:10 AM

## 2015-12-28 ENCOUNTER — Ambulatory Visit: Payer: BLUE CROSS/BLUE SHIELD | Admitting: Physical Therapy

## 2015-12-29 ENCOUNTER — Encounter: Payer: Self-pay | Admitting: Nurse Practitioner

## 2015-12-29 ENCOUNTER — Ambulatory Visit (HOSPITAL_BASED_OUTPATIENT_CLINIC_OR_DEPARTMENT_OTHER): Payer: BLUE CROSS/BLUE SHIELD | Admitting: Nurse Practitioner

## 2015-12-29 VITALS — BP 102/68 | HR 63 | Temp 98.1°F | Resp 18 | Ht 66.0 in | Wt 149.5 lb

## 2015-12-29 DIAGNOSIS — Z853 Personal history of malignant neoplasm of breast: Secondary | ICD-10-CM | POA: Diagnosis not present

## 2015-12-29 DIAGNOSIS — C50411 Malignant neoplasm of upper-outer quadrant of right female breast: Secondary | ICD-10-CM

## 2015-12-29 NOTE — Progress Notes (Signed)
CLINIC:  Cancer Survivorship   REASON FOR VISIT:  Routine follow-up post-treatment for a recent history of breast cancer.  BRIEF ONCOLOGIC HISTORY:  Oncology History   Breast cancer of upper-outer quadrant of right female breast [facility])   Staging form: Breast, AJCC 7th Edition     Clinical stage from 09/20/2015: Stage IA (T1c, N0, M0) - Unsigned     Pathologic stage from 11/04/2015: Stage IIA (T2(m), N0, cM0) - Signed by Enid Cutter, MD on 11/21/2015       Staging comments: Staged on final surgical specimen by Dr. Saralyn Pilar.        Breast cancer of upper-outer quadrant of right female breast ([place])   09/19/2015 Mammogram Right breast: 2.5 cm irregular high density mass with spiculated margin at 12:00, middle depth.  Further imaging needed.   09/20/2015 Breast US Right breast: 2 lesions: 2.5 cm irregular mass at 12:00, middle depth.  0.5 cm circumscribed oval mass at 10:00, posterior depth, 6 cm from nipple   09/20/2015 Initial Biopsy Right breast core needle biopsy 12:00 position showed invasive lobular carcinoma, second biopsy at 9:00 showed similar invasive lobular carcinoma, grade 1, ER+ (70%), PR+  (90%), HER-2 negative, Ki-67 20%   09/26/2015 Breast MRI Breast MRI confirmed biopsy-proven right breast 2 cm lesion at 12:00, and 1.3 cm lesion at 9:00 clock, and additional 0.9 cm non-mass enhancement at 6:00 position of the right breast, and oral enhancing mass at the 7:00 of the left breast measuring 0.7 cm   09/26/2015 Clinical Stage Stage IA: T1c N0   09/29/2015 Procedure Breast/Ovarian panel reveals no clinically significant variant at ATM, BARD1, BRCA1, BRCA2, BRIP1, CDH1, CHEK2, EPCAM, FANCC, MLH1, MSH2, MSH6, NBN, PALB2, PMS2, PTEN, RAD51C, RAD51D, TP53, and XRCC2   11/01/2015 Definitive Surgery Bilateral mastectomy/R SLNB: RIGHT 2 foci invasive lobular carcinoma, 3.0 cm and 2.0 cm, grade 2, margins were negative, a total of 4 lymph nodes were negative (0/4). No lymphovascular invasion, LEFT:  negative for malignancy, fibrocystic changes   11/01/2015 Pathologic Stage Stage IIA: pT2(m), pN0   11/01/2015 Oncotype testing Recurrence score 13, low risk, which predicts 10 year risk of distant recurrence 9% with tamoxifen alone.    Anti-estrogen oral therapy Considering initiation of endocrine therapy; to discuss further with Dr. Burr Medico in February 2017    INTERVAL HISTORY:  Amber Pugh presents to the [facility] today for our initial meeting to review her survivorship care plan detailing her treatment course for breast cancer, as well as monitoring long-term side effects of that treatment, education regarding health maintenance, screening, and overall wellness and health promotion.     Overall, Amber Pugh reports feeling well since her surgery in November 2016.  She is seeing a physical therapist and undergoing treatments at [facility] specialists with good results. She is still struggling with the decision as to whether to initiate endocrine therapy based on the potential for side effects, and plans to discuss further with Dr. Burr Medico in February 2017.  She continues to care for her husband who is undergoing treatment for colorectal cancer.  She denies any change along her mastectomy incision. She does continue with tightness along her chest wall, which is relieved with her integrative therapies.  She denies other pain.  Amber Pugh has had no headache, cough, shortness of breath or bone pain.  She has a good appetite and denies  any weight loss.    REVIEW OF SYSTEMS:  General: Denies fever, chills, unintentional weight loss, or generalized fatigue.  HEENT:  Difficulty with reading small print and dry eyes. Denies hearing loss, mouth sores or difficulty swallowing. Cardiac: Denies palpitations, chest pain, and lower extremity edema.  Respiratory: Denies wheeze or dyspnea on exertion.  GI: Denies abdominal pain, constipation, diarrhea, nausea, or vomiting.  GU: Denies dysuria,  hematuria, vaginal bleeding, vaginal discharge, or vaginal dryness.  Musculoskeletal: Denies joint or bone pain.  Neuro: Denies recent fall or numbness / tingling in her extremities. Skin: Denies rash, pruritis, or open wounds.  Psych: Denies depression, anxiety, insomnia, or memory loss.   A 14-point review of systems was completed and was negative, except as noted above.   ONCOLOGY TREATMENT TEAM:  1. Surgeon:  Dr. Donne Hazel at [facility]  2. Medical Oncologist: Dr. Burr Medico     PAST MEDICAL/SURGICAL HISTORY:  Past Medical History  Diagnosis Date  . Grave's disease     GRAVES DISEASE  . Migraines     without aura  . Abnormal Pap smear of cervix 6/02    +HPV HR, CIN1  . MVP (mitral valve prolapse)     neg cardio workup  . Melasma   . Basal cell carcinoma   . Breast cancer ([place])   . Graves disease   . Joint pain   . Graves disease 1999    she was treatmented with medications 3 times, in remission for the past 3 years ago  . Anxiety    Past Surgical History  Procedure Laterality Date  . Cryotherapy  3/01    CIN1  . Wisdom tooth extraction    . Mastectomy w/ sentinel node biopsy Bilateral 11/01/2015    Procedure: BILATERAL TOTAL MASTECTOMY WITH RIGHT SENTINEL LYMPH NODE BIOPSY;  Surgeon: Rolm Bookbinder, MD;  Location: [facility] [place] [facility];  Service: General;  Laterality: Bilateral;     ALLERGIES:  Allergies  Allergen Reactions  . Cefdinir     GI upset, nausea,vomiting & diarrhea ([employer]mnice[employer])  . Tetracyclines & Related Other (See Comments)    Headache,nausea, stomach ulcer     CURRENT MEDICATIONS:  Current Outpatient Prescriptions on File Prior to Visit  Medication Sig Dispense Refill  . acetaminophen (TYLENOL) 325 MG tablet Take 650 mg by mouth every 6 (six) hours as needed.    Marland Kitchen CALCIUM PO Take by mouth as needed.    . magnesium gluconate (MAGONATE) 500 MG tablet Take 500 mg by mouth 3 (three) times daily.    . Multiple Vitamins-Minerals  (MULTIVITAMIN PO) Take by mouth daily.    Amber Pugh 500 MG CAPS Take 1 capsule by mouth 2 (two) times daily.    . Probiotic Product (PROBIOTIC DAILY PO) Take 1 tablet by mouth daily. 25 million colonies    . Turmeric 500 MG CAPS Take by mouth.    Marland Kitchen UNABLE TO FIND Rosemary , Tumeric, ginger  Two to Three times/day    . Zinc 30 MG CAPS Take 1 capsule by mouth daily. With Biotin     No current facility-administered medications on file prior to visit.     ONCOLOGIC FAMILY HISTORY:  Family History  Problem Relation Age of Onset  . Hypertension Father   . Diabetes Father   . Skin cancer Father     unknown type  . Gout Brother   . Hypertension Brother   . Pulmonary embolism Maternal Grandfather   . Diabetes Maternal Grandfather   . Prostate cancer Other     MGMs brother dx in his 75s-80s     GENETIC COUNSELING/TESTING: Yes, performed 09/29/2015.  Breast/Ovarian panel reveals no clinically significant variant at ATM, BARD1, BRCA1, BRCA2, BRIP1, CDH1, CHEK2, EPCAM, FANCC, MLH1, MSH2, MSH6, NBN, PALB2, PMS2, PTEN, RAD51C, RAD51D, TP53, and XRCC2   SOCIAL HISTORY:  Amber Pugh is married and lives with her spouse in [place], [place].  She has no children. Amber Pugh is currently not working.  She denies any current or history of tobacco, alcohol, or illicit drug use.     PHYSICAL EXAMINATION:  Vital Signs: Filed Vitals:   12/29/15 1337  BP: 102/68  Pulse: 63  Temp: 98.1 F (36.7 C)  Resp: 18   ECOG Performance Status: 0  General: Well-nourished, well-appearing female in no acute distress.  She is unaccompanied in clinic today.   HEENT: Head is atraumatic and normocephalic.  Pupils equal and reactive to light and accomodation. Conjunctivae clear without exudate.  Sclerae anicteric. Oral mucosa is pink, moist, and intact without lesions.  Oropharynx is pink without lesions or erythema.  Lymph: No cervical, supraclavicular, infraclavicular, or axillary  lymphadenopathy noted on palpation.  Cardiovascular: Regular rate and rhythm without murmurs, rubs, or gallops. Respiratory: Clear to auscultation bilaterally. Chest expansion symmetric without accessory muscle use on inspiration or expiration.  GI: Abdomen soft and round. No tenderness to palpation. Bowel sounds normoactive in 4 quadrants.   GU: Deferred.   Neuro: No focal deficits. Steady gait.  Psych: Mood and affect normal and appropriate for situation.  Extremities: No edema, cyanosis, or clubbing.  Skin: Warm and dry. No open lesions noted.   LABORATORY DATA:  None for this visit.  DIAGNOSTIC IMAGING:  None for this visit.     ASSESSMENT AND PLAN:   1. Breast cancer: Stage IIA invasive lobular carcinoma of the right breast, grade 1, ER positive, PR positive, HER2/neu negative, S/P bilateral mastectomies with fibrocystic changes on the left, considering endocrine therapy with tamoxifen vs. Aromatase inhibitor / ovarian suppression at this time.  Amber Pugh is doing well without clinical symptoms worrisome for disease recurrence. She will follow-up with her medical oncologist,  Dr. Burr Medico, in February 2017 with history and physical examination per surveillance protocol, as well as Dr. Donne Hazel next month.  She is continuing to consider the initiation of endocrine therapy (tamoxifen vs. AI with ovarian suppression) at this time.  We have spent the majority of today's visit discussing the intent of therapy of each as well as reviewed potential side effects. She will continue to contemplate her decision and follow up with Dr. Burr Medico in February. A comprehensive survivorship care plan and treatment summary was reviewed with the patient today detailing her breast cancer diagnosis, treatment course, potential late/long-term effects of treatment, appropriate follow-up care with recommendations for the future, and patient education resources.  A copy of this summary, along with a letter will be sent to  the patient's primary care provider via in basket message after today's visit.  Amber Pugh is welcome to return to the Survivorship Clinic in the future, as needed; no follow-up will be scheduled at this time.    2. Cancer screening:  Due to Amber Pugh's history and her age, she should receive screening for skin cancers, colon cancer, and gynecologic cancers.  The information and recommendations are listed on the patient's comprehensive care plan/treatment summary and were reviewed in detail with the patient.    3. Health maintenance and wellness promotion: Amber Pugh was encouraged to continue to consume 5-7 servings of fruits and vegetables per day. We reviewed the "Nutrition Rainbow" handout, as well as  discussed recommendations to maximize nutrition and minimize recurrence, such as increased intake of fruits, vegetables, lean proteins, and minimizing the intake of red meats and processed foods.  She is very well versed in nutrition. She was also encouraged to continue to engage in moderate to vigorous exercise for 30 minutes per day most days of the week. We discussed the [employer] fitness program, which is designed for cancer survivors to help them become more physically fit after cancer treatments.  She was instructed to limit her alcohol consumption and continue to abstain from tobacco use.  A copy of the "Take Control of Your Health" brochure was given to her reinforcing these recommendations.   4. Support services/counseling: It is not uncommon for this period of the patient's cancer care trajectory to be one of many emotions and stressors. We discussed an opportunity for her to participate in the next session of [facility] ("Finding Your New Normal") support group series designed for patients after they have completed treatment. Amber Pugh was encouraged to take advantage of our many other support services programs, support groups, and/or counseling in coping with her new life as a cancer survivor  after completing anti-cancer treatment.  She was offered support today through active listening and expressive supportive counseling.  She was given information regarding our available services and encouraged to contact me with any questions or for help enrolling in any of our support group/programs.    A total of 60 minutes of face-to-face time was spent with this patient with greater than 50% of that time in counseling and care-coordination.   Sylvan Cheese, NP  Survivorship Program [facility] [phone]   Note: PRIMARY CARE PROVIDER Wynelle Fanny [phone]

## 2015-12-30 ENCOUNTER — Ambulatory Visit: Payer: BLUE CROSS/BLUE SHIELD | Admitting: Physical Therapy

## 2016-01-03 ENCOUNTER — Ambulatory Visit: Payer: BLUE CROSS/BLUE SHIELD | Admitting: Physical Therapy

## 2016-01-06 ENCOUNTER — Ambulatory Visit: Payer: BLUE CROSS/BLUE SHIELD | Admitting: Physical Therapy

## 2016-01-10 ENCOUNTER — Ambulatory Visit: Payer: BLUE CROSS/BLUE SHIELD

## 2016-01-13 ENCOUNTER — Ambulatory Visit: Payer: BLUE CROSS/BLUE SHIELD | Admitting: Physical Therapy

## 2016-01-17 ENCOUNTER — Ambulatory Visit: Payer: BLUE CROSS/BLUE SHIELD | Admitting: Physical Therapy

## 2016-01-20 ENCOUNTER — Ambulatory Visit: Payer: BLUE CROSS/BLUE SHIELD | Admitting: Physical Therapy

## 2016-01-31 ENCOUNTER — Ambulatory Visit: Payer: BLUE CROSS/BLUE SHIELD | Admitting: Physical Therapy

## 2016-02-03 ENCOUNTER — Encounter: Payer: BLUE CROSS/BLUE SHIELD | Admitting: Physical Therapy

## 2016-02-14 ENCOUNTER — Ambulatory Visit (HOSPITAL_BASED_OUTPATIENT_CLINIC_OR_DEPARTMENT_OTHER): Payer: BLUE CROSS/BLUE SHIELD | Admitting: Hematology

## 2016-02-14 ENCOUNTER — Telehealth: Payer: Self-pay | Admitting: Hematology

## 2016-02-14 ENCOUNTER — Encounter: Payer: Self-pay | Admitting: Hematology

## 2016-02-14 ENCOUNTER — Ambulatory Visit (HOSPITAL_BASED_OUTPATIENT_CLINIC_OR_DEPARTMENT_OTHER): Payer: BLUE CROSS/BLUE SHIELD

## 2016-02-14 VITALS — BP 103/71 | HR 60 | Temp 98.1°F | Resp 18 | Ht 66.0 in | Wt 150.8 lb

## 2016-02-14 DIAGNOSIS — C50411 Malignant neoplasm of upper-outer quadrant of right female breast: Secondary | ICD-10-CM

## 2016-02-14 DIAGNOSIS — Z170 Estrogen receptor positive status [ER+]: Secondary | ICD-10-CM

## 2016-02-14 DIAGNOSIS — C50811 Malignant neoplasm of overlapping sites of right female breast: Secondary | ICD-10-CM | POA: Diagnosis not present

## 2016-02-14 DIAGNOSIS — E0500 Thyrotoxicosis with diffuse goiter without thyrotoxic crisis or storm: Secondary | ICD-10-CM

## 2016-02-14 LAB — CBC WITH DIFFERENTIAL/PLATELET
BASO%: 0.7 % (ref 0.0–2.0)
BASOS ABS: 0.1 10*3/uL (ref 0.0–0.1)
EOS%: 1.4 % (ref 0.0–7.0)
Eosinophils Absolute: 0.1 10*3/uL (ref 0.0–0.5)
HCT: 42.1 % (ref 34.8–46.6)
HEMOGLOBIN: 13.9 g/dL (ref 11.6–15.9)
LYMPH#: 2 10*3/uL (ref 0.9–3.3)
LYMPH%: 26.4 % (ref 14.0–49.7)
MCH: 30 pg (ref 25.1–34.0)
MCHC: 32.9 g/dL (ref 31.5–36.0)
MCV: 91.2 fL (ref 79.5–101.0)
MONO#: 0.4 10*3/uL (ref 0.1–0.9)
MONO%: 5.6 % (ref 0.0–14.0)
NEUT#: 4.9 10*3/uL (ref 1.5–6.5)
NEUT%: 65.9 % (ref 38.4–76.8)
Platelets: 224 10*3/uL (ref 145–400)
RBC: 4.62 10*6/uL (ref 3.70–5.45)
RDW: 11.7 % (ref 11.2–14.5)
WBC: 7.5 10*3/uL (ref 3.9–10.3)

## 2016-02-14 LAB — COMPREHENSIVE METABOLIC PANEL
ALT: 19 U/L (ref 0–55)
AST: 16 U/L (ref 5–34)
Albumin: 4.3 g/dL (ref 3.5–5.0)
Alkaline Phosphatase: 52 U/L (ref 40–150)
Anion Gap: 10 mEq/L (ref 3–11)
BUN: 12.9 mg/dL (ref 7.0–26.0)
CHLORIDE: 104 meq/L (ref 98–109)
CO2: 25 meq/L (ref 22–29)
CREATININE: 0.9 mg/dL (ref 0.6–1.1)
Calcium: 9.4 mg/dL (ref 8.4–10.4)
EGFR: 82 mL/min/{1.73_m2} — ABNORMAL LOW (ref >90)
GLUCOSE: 76 mg/dL (ref 70–140)
POTASSIUM: 3.8 meq/L (ref 3.5–5.1)
SODIUM: 140 meq/L (ref 136–145)
Total Bilirubin: 0.58 mg/dL (ref 0.20–1.20)
Total Protein: 8.1 g/dL (ref 6.4–8.3)

## 2016-02-14 MED ORDER — TAMOXIFEN CITRATE 20 MG PO TABS: 20.0000 mg | ORAL_TABLET | Freq: Every day | ORAL | Status: DC

## 2016-02-14 NOTE — Progress Notes (Signed)
[place]  [phone] Fax:([phone]  Clinic Follow Up Note   Patient Care Team: Carlos Levering, PA-C as PCP - General (Family Medicine) Rolm Bookbinder, MD as Consulting Physician (General Surgery) Truitt Merle, MD as Consulting Physician (Hematology) Thea Silversmith, MD as Consulting Physician (Radiation Oncology) Mauro Kaufmann, RN as Registered Nurse Rockwell Germany, RN as Registered Nurse Sylvan Cheese, NP as Nurse Practitioner (Nurse Practitioner) 02/14/2016  CHIEF COMPLAINTS:  Follow Up right breast cancer    Oncology History   Breast cancer of upper-outer quadrant of right female breast [facility])   Staging form: Breast, AJCC 7th Edition     Clinical stage from 09/20/2015: Stage IA (T1c, N0, M0) - Unsigned     Pathologic stage from 11/04/2015: Stage IIA (T2(m), N0, cM0) - Signed by Enid Cutter, MD on 11/21/2015       Staging comments: Staged on final surgical specimen by Dr. Saralyn Pilar.        Breast cancer of upper-outer quadrant of right female breast ([place])   09/19/2015 Mammogram Right breast: 2.5 cm irregular high density mass with spiculated margin at 12:00, middle depth.  Further imaging needed.   09/20/2015 Breast US Right breast: 2 lesions: 2.5 cm irregular mass at 12:00, middle depth.  0.5 cm circumscribed oval mass at 10:00, posterior depth, 6 cm from nipple   09/20/2015 Initial Biopsy Right breast core needle biopsy 12:00 position showed invasive lobular carcinoma, second biopsy at 9:00 showed similar invasive lobular carcinoma, grade 1, ER+ (70%), PR+  (90%), HER-2 negative, Ki-67 20%   09/26/2015 Breast MRI Breast MRI confirmed biopsy-proven right breast 2 cm lesion at 12:00, and 1.3 cm lesion at 9:00 clock, and additional 0.9 cm non-mass enhancement at 6:00 position of the right breast, and oral enhancing mass at the 7:00 of the left breast measuring 0.7 cm   09/26/2015 Clinical Stage Stage IA: T1c N0   09/29/2015 Procedure  Breast/Ovarian panel reveals no clinically significant variant at ATM, BARD1, BRCA1, BRCA2, BRIP1, CDH1, CHEK2, EPCAM, FANCC, MLH1, MSH2, MSH6, NBN, PALB2, PMS2, PTEN, RAD51C, RAD51D, TP53, and XRCC2   11/01/2015 Definitive Surgery Bilateral mastectomy/R SLNB: RIGHT 2 foci invasive lobular carcinoma, 3.0 cm and 2.0 cm, grade 2, margins were negative, a total of 4 lymph nodes were negative (0/4). No lymphovascular invasion, LEFT: negative for malignancy, fibrocystic changes   11/01/2015 Pathologic Stage Stage IIA: pT2(m), pN0   11/01/2015 Oncotype testing Recurrence score 13, low risk, which predicts 10 year risk of distant recurrence 9% with tamoxifen alone.    Anti-estrogen oral therapy Considering initiation of endocrine therapy; to discuss further with Dr. Burr Medico in February 2017   12/29/2015 Survivorship Survivorship visit completed; copy of care plan given to patient.    HISTORY OF PRESENTING ILLNESS:  Amber Pugh 45 y.o. female is here because of her recently diagnosed right breast cancer. She is accompanied by her parents in low to our multidisciplinary breast clinic today.  She went to see her GYN nurse practitioner a few weeks ago for routine follow-up, breast exam reviewed a palpable right breast mass at 12:00 position. Patient does not feel the mass before her visit. This prompted a mammogram, which was her first mammogram in her life, on 09/19/2015. Which showed a 2.5 cm irregular hyperdense mass in the right breast 12:00 position, and a 8 mm mass at night clock position, which is suspicious for malignancy. She underwent ultrasound-guided needle biopsy of post the 12:00 and 9 clock position  mass, both showed invasive lobular carcinoma. She was underwent breast MRI on 09/26/2015, which confirmed the biopsy-proven right breast cancer and 12 and 9 clock position, an additional a non-mass enhancement at the 6:00 position of right breast, and an oval enhancing mass at 7:00 of the left breast  measuring 0.7 cm. The second look ultrasound of the left breast was negative for masses.  Her past medical history was positive for Graves' disease, which she was treated with medications and has been in remission for the past 3 years. She is very active, a true believer of healthy diet and herb supplement and nutritional supplement. Her husband was diagnosed with stage II colon cancer, and is currently undergoing adjuvant chemotherapy under my care. I've known her since 3 months ago.  CURRENT THERAPY: she will start Tamoxifen in a few days   INTERIM HISTORY: Amber Pugh returns for follow-up. She has been recovering well from surgery. She still has moderate fatigue, able to tolrate routine acitivities, but has not been able to return to the exercise level she was on before, and still on PT twice a week, sleeps well, good appetite. Her surgical incision has healed up very well, occasional shooting pain and tenderness on the chest wall, no limitation on the morbidity of shoulder.She otherwise is doing we  MEDICAL HISTORY:  Past Medical History  Diagnosis Date  . Grave's disease     GRAVES DISEASE  . Migraines     without aura  . Abnormal Pap smear of cervix 6/02    +HPV HR, CIN1  . MVP (mitral valve prolapse)     neg cardio workup  . Melasma   . Basal cell carcinoma   . Breast cancer ([address])   . Graves disease   . Joint pain   . Graves disease 1999    she was treatmented with medications 3 times, in remission for the past 3 years ago  . Anxiety     SURGICAL HISTORY: Past Surgical History  Procedure Laterality Date  . Cryotherapy  3/01    CIN1  . Wisdom tooth extraction    . Mastectomy w/ sentinel node biopsy Bilateral 11/01/2015    Procedure: BILATERAL TOTAL MASTECTOMY WITH RIGHT SENTINEL LYMPH NODE BIOPSY;  Surgeon: Rolm Bookbinder, MD;  Location: [facility];  Service: General;  Laterality: Bilateral;   GYN HISTORY  Menarchal: 15 LMP: 09/07/2015 Contraceptive: no HRT:  no G0P0:   SOCIAL HISTORY: Social History   Social History  . Marital Status: Married    Spouse Name: N/A  . Number of Children: N/A  . Years of Education: N/A   Occupational History  . Not on file.   Social History Main Topics  . Smoking status: Never Smoker   . Smokeless tobacco: Not on file  . Alcohol Use: No  . Drug Use: No  . Sexual Activity:    Partners: Male     Comment: withdrawal   Other Topics Concern  . Not on file   Social History Narrative    FAMILY HISTORY: Family History  Problem Relation Age of Onset  . Hypertension Father   . Diabetes Father   . Skin cancer Father     unknown type  . Gout Brother   . Hypertension Brother   . Pulmonary embolism Maternal Grandfather   . Diabetes Maternal Grandfather   . Prostate cancer Other     MGMs brother dx in his 9s-80s    ALLERGIES:  is allergic to cefdinir and tetracyclines & related.  MEDICATIONS:  Current Outpatient Prescriptions  Medication Sig Dispense Refill  . acetaminophen (TYLENOL) 325 MG tablet Take 650 mg by mouth every 6 (six) hours as needed.    Marland Kitchen CALCIUM PO Take by mouth as needed.    . magnesium gluconate (MAGONATE) 500 MG tablet Take 500 mg by mouth 3 (three) times daily.    . Multiple Vitamins-Minerals (MULTIVITAMIN PO) Take by mouth daily.    Jonna Coup Leaf Extract 500 MG CAPS Take 1 capsule by mouth 2 (two) times daily.    . Probiotic Product (PROBIOTIC DAILY PO) Take 1 tablet by mouth daily. 25 million colonies    . Turmeric 500 MG CAPS Take by mouth.    Marland Kitchen UNABLE TO FIND Rosemary , Tumeric, ginger  Two to Three times/day    . Zinc 30 MG CAPS Take 1 capsule by mouth daily. With Biotin    . tamoxifen (NOLVADEX) 20 MG tablet Take 1 tablet (20 mg total) by mouth daily. 30 tablet 2   No current facility-administered medications for this visit.    REVIEW OF SYSTEMS:   Constitutional: Denies fevers, chills or abnormal night sweats, (+) fatigue  Eyes: Denies blurriness of vision,  double vision or watery eyes Ears, nose, mouth, throat, and face: Denies mucositis or sore throat Respiratory: Denies cough, dyspnea or wheezes Cardiovascular: Denies palpitation, chest discomfort or lower extremity swelling Gastrointestinal:  Denies nausea, heartburn or change in bowel habits Skin: Denies abnormal skin rashes Lymphatics: Denies new lymphadenopathy or easy bruising Neurological:Denies numbness, tingling or new weaknesses Behavioral/Psych: Mood is stable, no new changes  All other systems were reviewed with the patient and are negative.  PHYSICAL EXAMINATION: ECOG PERFORMANCE STATUS: 1  Filed Vitals:   02/14/16 1351  BP: 103/71  Pulse: 60  Temp: 98.1 F (36.7 C)  Resp: 18   Filed Weights   02/14/16 1351  Weight: 150 lb 12.8 oz (68.402 kg)    GENERAL:alert, no distress and comfortable SKIN: skin color, texture, turgor are normal, no rashes or significant lesions EYES: normal, conjunctiva are pink and non-injected, sclera clear OROPHARYNX:no exudate, no erythema and lips, buccal mucosa, and tongue normal  NECK: supple, thyroid normal size, non-tender, without nodularity LYMPH:  no palpable lymphadenopathy in the cervical, axillary or inguinal LUNGS: clear to auscultation and percussion with normal breathing effort HEART: regular rate & rhythm and no murmurs and no lower extremity edema ABDOMEN:abdomen soft, non-tender and normal bowel sounds Musculoskeletal:no cyanosis of digits and no clubbing  PSYCH: alert & oriented x 3 with fluent speech NEURO: no focal motor/sensory deficits BREAST: bilateral breasts are surgically absent, surgical incisions are well healed, no nodularity or palpable mass on th  LABORATORY DATA:  I have reviewed the data as listed Lab Results  Component Value Date   WBC 7.5 02/14/2016   HGB 13.9 02/14/2016   HCT 42.1 02/14/2016   MCV 91.2 02/14/2016   PLT 224 02/14/2016    Recent Labs  09/28/15 0818 10/27/15 1300  12/15/15 1347 02/14/16 1517  NA 138 141 137 140  K 3.9 3.6 3.7 3.8  CL  --  101  --   --   CO2 _0 GLUCOSE 101 87 85 76  BUN 11.6 12 11.3 12.9  CREATININE 0.9 0.76 0.8 0.9  CALCIUM 9.1 9.8 9.3 9.4  GFRNONAA  --  >60  --   --   GFRAA  --  >60  --   --   PROT 7.5  --  7.8  8.1  ALBUMIN 4.1  --  4.3 4.3  AST 15  --  15 16  ALT 15  --  15 19  ALKPHOS 43  --  49 52  BILITOT 0.61  --  0.69 0.58    PATHOLOGY REPORT  Diagnosis 11/01/2015 1. Breast, simple mastectomy, Left - FIBROCYSTIC CHANGES. - PSEUDOANGIOMATOUS STROMAL HYPERPLASIA ([place]). - BIOPSY CAVITY. - NO EVIDENCE OF MALIGNANCY. 2. Breast, simple mastectomy, Right - INVASIVE LOBULAR CARCINOMA, TWO FOCI, 3.0 CM AND 2.0 CM. - MARGINS NOT INVOLVED. - TWO BENIGN LYMPH NODES (0/2). - FIBROCYSTIC CHANGES AND FIBROADENOMA. - SEE ONCOLOGY TABLE BELOW. 3. Lymph node, sentinel, biopsy, Right axillary - ONE BENIGN LYMPH NODE (0/1). 4. Lymph node, sentinel, biopsy, Right axillary - ONE BENIGN LYMPH NODE (0/1). Microscopic Comment 2. BREAST, INVASIVE TUMOR, WITH LYMPH NODES PRESENT Specimen, including laterality and lymph node sampling (sentinel, non-sentinel): Right breast with two sentinel lymph nodes and two non-sentinel lymph nodes. Procedure: Mastectomy and lymph node sampling. Histologic type: Lobular. Grade: 2. Tubule formation: 3. Nuclear pleomorphism: 2. Mitotic: 2. Tumor size (gross measurement: 3.0 cm and 2.0 cm. Margins: Free of tumor. Invasive, distance to closest margin: 1.5 cm from posterior margin. In-situ, distance to closest margin: N/A. If margin positive, focally or broadly: N/A. Lymphovascular invasion: Not identified. Ductal carcinoma in situ: No. Grade: N/A. Extensive intraductal component: N/A. Tumor focality: Two foci of tumor. Treatment effect: No. If present, treatment effect in breast tissue, lymph nodes or both: N/A. Extent of tumor: Skin: Free of tumor. Nipple: Free of  tumor. Skeletal muscle: N/A. Lymph nodes: Examined: 2 Sentinel. 2 Non-sentinel 4 Total Lymph nodes with metastasis: 0. Isolated tumor cells (< 0.2 mm): 0. Micrometastasis: (> 0.2 mm and < 2.0 mm): 0. 2 of 5 FINAL for Amber, Pugh ([id number]) Microscopic Comment(continued) Macrometastasis: (> 2.0 mm): 0. Extracapsular extension: N/A. Breast prognostic profile: Case # [id number]. Estrogen receptor: 70%, strong staining. Progesterone receptor: 90%, strong staining. Her 2 neu: Negative, ratio 1.29. Will be repeated on current specimen. Ki-67: 20%. Non-neoplastic breast: Fibrocystic changes and fibroadenoma. TNM: pT2 (multifocal), pN0, pMX. Comments: There are two tumors which are 3.0 and 2.0 cm in greatest dimension, and are each located 1.5 cm from the nearest posterior margin. Immunohistochemistry for E-Cadherin is performed and the tumor is negative consistent with lobular differentiation. Immunohistochemistry with cytokeratin AE1/AAE3 is negative in all of the lymph nodes. (JDP:ds 11/03/15) JOHN  ADDITIONAL INFORMATION: 2. C. FLUORESCENCE IN-SITU HYBRIDIZATION (BLOCK 2C) Results: HER2 - NEGATIVE RATIO OF HER2/CEP17 SIGNALS 1.07 AVERAGE HER2 COPY NUMBER PER CELL 1.55 Reference Range: NEGATIVE HER2/CEP17 Ratio <2.0 and average HER2 copy number <4.0 EQUIVOCAL HER2/CEP17 Ratio <2.0 and average HER2 copy number >=4.0 and <6.0 POSITIVE HER2/CEP17 Ratio >=2.0 or <2.0 and average HER2 copy number >=6.0 G. FLUORESCENCE IN-SITU HYBRIDIZATION Results: HER2 - NEGATIVE RATIO OF HER2/CEP17 SIGNALS 1.00 AVERAGE HER2 COPY NUMBER PER CELL 1.65 Reference  Oncotype DX: Recurrence score 13, low risk, which predicts 10 year risk of distant recurrence 9% with tamoxifen alone.  RADIOGRAPHIC STUDIES: I have personally reviewed the radiological images as listed and agreed with the findings in the report.  No new scans    ASSESSMENT & PLAN: 45 year old premenopausal Caucasian  female  1. Multifocal right breast invasive lobular carcinoma, mpT2N0M0, stage II, G2, ER+/PR+/HER2- -I reviewed her surgical pathology findings with her in great details. She has multifocal (2) stage II lobular carcinoma, surgical margins were negative. -I reviewed her Oncotype DX test result. The recurrence score is 13, which  predicts 9% 10 year risk of distant recurrence with tamoxifen alone, it is considered as low risk. So I do not recommend adjuvant chemotherapy. --Giving her strongly ER and PR positivity of the tumor cells, and her pre-menopause status, I recommend adjuvant endocrine therapy with tamoxifen. The potential side effects, which includes but not limited to, hot flash, skin and vaginal dryness, slightly increased risk of cardiovascular disease and cataract, small risk of thrombosis and endometrial cancer, were discussed with her in great details. Preventive strategies for thrombosis, such as being physically active, using compression stocks, avoid cigarette smoking, etc., were reviewed with her. I also recommend her to follow-up with her gynecologist once a year, and watch for vaginal spotting or bleeding, as a clinically sign of endometrial cancer, etc.  -he has recovered very her breast surgery 3 months ago. I strongly encouraged her to start tamoxifen in the next few days. She does have many concerns about potential side effects especially mood swing, menorrhagia, etc. -After a lengthy discussion, she agrees to start, but states she will come off tamoxifenf she has intolerable side effects, which I agree. -she declined aromatase inhibitor and ovarian suppression. Given her early stage disease, I do not  strongly recommended it over Tamoxifen due to the small benefit gain  -will Continue breast cancer surveillance. She does not need mammogram. We'll continue routine physical exam and lab.   2. Grave's disease -She'll continue follow-up with her primary care physician, she is in the  process to find a new endocrinologist. -I will repeat her TSH, free T3 and T4 due to her recent fatigue   3. Genetics -Negative genetic testing on the Breast/Ovarian cancer panel.  The Breast/Ovarian gene panel offered by [facility] includes sequencing and rearrangement analysis for the following 20 genes:  ATM, BARD1, BRCA1, BRCA2, BRIP1, CDH1, CHEK2, EPCAM, FANCC, MLH1, MSH2, MSH6, NBN, PALB2, PMS2, PTEN, RAD51C, RAD51D, TP53, and XRCC2  Plan -she'll start tamoxifen 20 mg once daily in the next few days. Prescription was called in today -lab today -RTC in 6 weeks for follow up   All questions were answered. The patient knows to call the clinic with any problems, questions or concerns. I spent 30 minutes counseling the patient face to face. The total time spent in the appointment was 40 minutes and more than 50% was on counseling.     Truitt Merle, MD 02/14/2016

## 2016-02-14 NOTE — Telephone Encounter (Signed)
per pof to sch pt appt-gave pt copy of avs °

## 2016-02-15 LAB — T3, FREE: Triiodothyronine,Free,Serum: 2.8 pg/mL (ref 2.0–4.4)

## 2016-02-15 LAB — TSH: TSH: 2.758 m(IU)/L (ref 0.308–3.960)

## 2016-02-15 LAB — T4, FREE: FREE T4: 1.06 ng/dL (ref 0.82–1.77)

## 2016-02-21 ENCOUNTER — Telehealth: Payer: Self-pay | Admitting: *Deleted

## 2016-02-21 NOTE — Telephone Encounter (Signed)
-----   Message from Truitt Merle, MD sent at 02/19/2016 11:09 PM EST ----- Amber Pugh, please call pt and let her lab results: CBC, CMP and thyroid function are all normal. Thanks.  Truitt Merle  02/19/2016

## 2016-02-21 NOTE — Telephone Encounter (Signed)
Left message on pt's identified mobile # that labs normal & to call back to verify she received message & if any questions.

## 2016-02-24 NOTE — Telephone Encounter (Signed)
-----   Message from Truitt Merle, MD sent at 02/19/2016 11:09 PM EST ----- Janifer Adie, please call pt and let her lab results: CBC, CMP and thyroid function are all normal. Thanks.  Truitt Merle  02/19/2016

## 2016-02-24 NOTE — Telephone Encounter (Signed)
Pt was given results per Dr Burr Medico.  She was able to see these results on MyChart. She will send these results to her PCP.

## 2016-03-09 ENCOUNTER — Telehealth: Payer: Self-pay | Admitting: Certified Nurse Midwife

## 2016-03-09 NOTE — Telephone Encounter (Signed)
Spoke with patient. Patient states that her oncologist has recommended that she start on Tamoxifen. She would like to come in to discuss this recommendation with Melvia Heaps CNM. Appointment scheduled for 03/13/2016 at 12:45 pm with Melvia Heaps CNM. She is agreeable to date and time.  Routing to provider for final review. Patient agreeable to disposition. Will close encounter.

## 2016-03-09 NOTE — Telephone Encounter (Signed)
Patient's oncologist suggest she go on Tomoxifen and she would like to discuss this with Debbie.

## 2016-03-13 ENCOUNTER — Ambulatory Visit (INDEPENDENT_AMBULATORY_CARE_PROVIDER_SITE_OTHER): Payer: BLUE CROSS/BLUE SHIELD | Admitting: Certified Nurse Midwife

## 2016-03-13 ENCOUNTER — Encounter: Payer: Self-pay | Admitting: Certified Nurse Midwife

## 2016-03-13 VITALS — BP 118/72 | HR 70 | Resp 14 | Ht 66.25 in | Wt 151.8 lb

## 2016-03-13 DIAGNOSIS — Z853 Personal history of malignant neoplasm of breast: Secondary | ICD-10-CM | POA: Diagnosis not present

## 2016-03-13 NOTE — Progress Notes (Signed)
GYNECOLOGY  VISIT   HPI: 45 y.o.   Married  Caucasian  female  [id number] with Patient's last menstrual period was 03/05/2016 (exact date).  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 Consult only. Patient had bilateral mastectomy at her request. All lymph nodes sampled were negative. See epic note surgery and pathology. Patient has had a very stressful past 6 months with cancer diagnosis, surgery and extended physical therapy, dealing with this and her husband with colon and rectal cancer for the past 2 years who is still under treatment with radiation and chemotherapy. "we have been doing our best to care for each other". Patient had her las visit with oncology team to discuss plan and management. Recommendation was Tamoxifen for the next 5 years. She was given the risk ratio with and without use. Patient has also been a healthy person with good diet and alternative therapy and listened to her feelings. "I just feel this will not help me and will cause more problems". Concerns of vaginal bleeding, damage to healthy uterus,ovaries, and the side effects discussed with the hormonal effects of Tamoxifen. "Feel like I was pushed to do this, whether I want to or not". She feels she finally is feeling normal again and does not want to change that. Denies any current problems with period or pelvic pain.  Spouse stable, but weak. Family limited support . No other concerns today. Here for discussion only " because you found my cancer and understand me".   GYNECOLOGIC HISTORY: Patient's last menstrual period was 03/05/2016 (exact date). Contraception: none Last mammogram: 10-06-15- stage 11b Lobular Breast Cancer Last pap smear: 09-14-15 HPV HR neg  OB History    Gravida Para Term Preterm AB TAB SAB Ectopic Multiple Living   0 0 0 0 0 0 0 0 0 0         Patient Active Problem List   Diagnosis Date Noted  . Genetic  testing 10/06/2015  . Breast cancer of upper-outer quadrant of right female breast (HCC) 09/23/2015  . Graves' disease     Past Medical History  Diagnosis Date  . Grave's disease     GRAVES DISEASE  . Migraines     without aura  . Abnormal Pap smear of cervix 6/02    +HPV HR, CIN1  . MVP (mitral valve prolapse)     neg cardio workup  . Melasma   . Basal cell carcinoma   . Breast cancer (HCC)   . Graves disease   . Joint pain   . Graves disease 1999    she was treatmented with medications 3 times, in remission for the past 3 years ago  . Anxiety     Past Surgical History  Procedure Laterality Date  . Cryotherapy  3/01    CIN1  . Wisdom tooth extraction    . Mastectomy w/ sentinel node biopsy Bilateral 11/01/2015    Procedure: BILATERAL TOTAL MASTECTOMY WITH RIGHT SENTINEL LYMPH NODE BIOPSY;  Surgeon: Matthew Wakefield, MD;  Location: [facility];  Service: General;  Laterality: Bilateral;    Current Outpatient Prescriptions  Medication Sig Dispense Refill  . acetaminophen (TYLENOL) 325 MG tablet Take 650 mg by mouth every 6 (six) hours as needed.    . CALCIUM PO Take by mouth as needed.    . Cholecalciferol (VITAMIN D3) 1000 units CAPS Take 2 capsules by mouth daily.    . magnesium gluconate (  MAGONATE) 500 MG tablet Take 500 mg by mouth 3 (three) times daily.    . Multiple Vitamins-Minerals (MULTIVITAMIN PO) Take by mouth daily.    Jonna Coup Leaf Extract 500 MG CAPS Take 1 capsule by mouth 2 (two) times daily.    . Probiotic Product (PROBIOTIC DAILY PO) Take 1 tablet by mouth daily. 25 million colonies    . tamoxifen (NOLVADEX) 20 MG tablet Take 1 tablet (20 mg total) by mouth daily. 30 tablet 2  . Turmeric 500 MG CAPS Take by mouth.    Marland Kitchen UNABLE TO FIND Rosemary , Tumeric, ginger  Two to Three times/day    . Zinc 30 MG CAPS Take 1 capsule by mouth daily. With Biotin     No current facility-administered medications for this visit.     ALLERGIES: Cefdinir  and Tetracyclines & related  Family History  Problem Relation Age of Onset  . Hypertension Father   . Diabetes Father   . Skin cancer Father     unknown type  . Gout Brother   . Hypertension Brother   . Pulmonary embolism Maternal Grandfather   . Diabetes Maternal Grandfather   . Prostate cancer Other     MGMs brother dx in his 27s-80s    Social History   Social History  . Marital Status: Married    Spouse Name: N/A  . Number of Children: N/A  . Years of Education: N/A   Occupational History  . Not on file.   Social History Main Topics  . Smoking status: Never Smoker   . Smokeless tobacco: Not on file  . Alcohol Use: No  . Drug Use: No  . Sexual Activity:    Partners: Male     Comment: withdrawal   Other Topics Concern  . Not on file   Social History Narrative    ROS:  Pertinent items are noted in HPI.  PHYSICAL EXAMINATION:    BP 118/72 mmHg  Pulse 70  Resp 14  Ht 5' 6.25" (1.683 m)  Wt 151 lb 12.8 oz (68.856 kg)  BMI 24.31 kg/m2  LMP 03/05/2016 (Exact Date)      General appearance: alert, cooperative and appears stated age   ASSESSMENT  History of breast cancer now has completed all surgical treatment and rehab Bilateral mastectomy Recommendation of Tamoxifen to help with prevention of future reoccurrence Social stress with spouse under treatment for colon and rectal cancer for past 2 years, on going    PLAN  Discussed with patient the journey she has completed and has done well with this. Discussed her 'feelings" that tamoxifen is not right for her and reviewed the oncology team notes with her. Discussed pros and cons of using and the potential benefits they had discussed with her and the risks,side effects. Discussed with patient PUS here to evaluate uterus and ovaries at this point prior to starting medication is she desires. Also discussed limited screening for ovarian cancer. Questions discussed at length with patient. Allowed patient to cry  and grieve the last 6  Months. Patient has had counseling during the last 6 months with her cancer also. Encouraged patient to discuss other alternatives with Oncology team also if she declines Tamoxifen. Encouraged patient to discuss with spouse and look at what she desires and feel she will make the right decision. Patient very appreciative to time spent and will advise if she would like to have screening as above here. Reminded she needs to have her aex in 6  Months.  Expressed my thoughts are with her in this decision.  Return in prn, aex    An After Visit Summary was printed and given to the patient.  45 minutes face to face time  was spent in counseling regarding her breast cancer management.    

## 2016-03-14 ENCOUNTER — Encounter: Payer: Self-pay | Admitting: Certified Nurse Midwife

## 2016-03-15 NOTE — Patient Instructions (Signed)

## 2016-03-16 NOTE — Progress Notes (Signed)
Encounter reviewed Shalisha Clausing, MD   

## 2016-03-26 ENCOUNTER — Telehealth: Payer: Self-pay | Admitting: Hematology

## 2016-03-26 NOTE — Telephone Encounter (Signed)
pt called to r/s appt....pt o and aware....pt ok and aware of new d.t in May

## 2016-03-27 ENCOUNTER — Ambulatory Visit: Payer: BLUE CROSS/BLUE SHIELD | Admitting: Hematology

## 2016-03-27 ENCOUNTER — Other Ambulatory Visit: Payer: BLUE CROSS/BLUE SHIELD

## 2016-05-25 ENCOUNTER — Telehealth: Payer: Self-pay | Admitting: Hematology

## 2016-05-25 NOTE — Telephone Encounter (Signed)
pt cld to CX appt-no r/s at this time

## 2016-05-29 ENCOUNTER — Ambulatory Visit: Payer: BLUE CROSS/BLUE SHIELD | Admitting: Hematology

## 2016-05-29 ENCOUNTER — Other Ambulatory Visit: Payer: BLUE CROSS/BLUE SHIELD

## 2016-06-26 ENCOUNTER — Other Ambulatory Visit: Payer: Self-pay | Admitting: Hematology

## 2016-06-26 DIAGNOSIS — C50411 Malignant neoplasm of upper-outer quadrant of right female breast: Secondary | ICD-10-CM

## 2016-06-27 ENCOUNTER — Telehealth: Payer: Self-pay | Admitting: Hematology

## 2016-06-27 NOTE — Telephone Encounter (Signed)
lvm for pt regarding to DEc appt.Marland KitchenMarland KitchenMarland Kitchen

## 2016-07-05 ENCOUNTER — Encounter (HOSPITAL_COMMUNITY): Payer: Self-pay

## 2016-08-30 ENCOUNTER — Telehealth: Payer: Self-pay | Admitting: *Deleted

## 2016-08-30 NOTE — Telephone Encounter (Signed)
Received message from Selena Lesser NP who took call intended for Survivorship RN reporting that pt would like to see a separate MD from her husband.  She is requesting [place] or [place].  Message to Dr Burr Medico for [place] to transfer.

## 2016-09-02 ENCOUNTER — Telehealth: Payer: Self-pay | Admitting: Hematology and Oncology

## 2016-09-02 NOTE — Telephone Encounter (Signed)
LVM ADVISING APPT 12/26 WITH DR. FENG CX'D. GAVE APPT WITH DR. Lindi Adie ON 12/19 @ 9.45AM.

## 2016-09-12 ENCOUNTER — Telehealth: Payer: Self-pay | Admitting: *Deleted

## 2016-09-12 NOTE — Telephone Encounter (Signed)
Pt left message stating she has decided she would like to begin hormone therapy. She does not see Dr Lindi Adie until December and would like to come sooner. She has also developed a new pain in her right rib cage.  Please advise

## 2016-09-12 NOTE — Telephone Encounter (Signed)
I can see her next week Please make appt thx

## 2016-09-17 ENCOUNTER — Ambulatory Visit (HOSPITAL_BASED_OUTPATIENT_CLINIC_OR_DEPARTMENT_OTHER): Payer: BLUE CROSS/BLUE SHIELD | Admitting: Hematology and Oncology

## 2016-09-17 ENCOUNTER — Encounter: Payer: Self-pay | Admitting: Hematology and Oncology

## 2016-09-17 ENCOUNTER — Ambulatory Visit (HOSPITAL_BASED_OUTPATIENT_CLINIC_OR_DEPARTMENT_OTHER): Payer: BLUE CROSS/BLUE SHIELD

## 2016-09-17 DIAGNOSIS — Z79810 Long term (current) use of selective estrogen receptor modulators (SERMs): Secondary | ICD-10-CM

## 2016-09-17 DIAGNOSIS — C50811 Malignant neoplasm of overlapping sites of right female breast: Secondary | ICD-10-CM

## 2016-09-17 DIAGNOSIS — Z170 Estrogen receptor positive status [ER+]: Secondary | ICD-10-CM

## 2016-09-17 DIAGNOSIS — R0789 Other chest pain: Secondary | ICD-10-CM | POA: Diagnosis not present

## 2016-09-17 DIAGNOSIS — C50411 Malignant neoplasm of upper-outer quadrant of right female breast: Secondary | ICD-10-CM

## 2016-09-17 LAB — CBC WITH DIFFERENTIAL/PLATELET
BASO%: 1.5 % (ref 0.0–2.0)
Basophils Absolute: 0.1 10*3/uL (ref 0.0–0.1)
EOS ABS: 0.1 10*3/uL (ref 0.0–0.5)
EOS%: 2.6 % (ref 0.0–7.0)
HEMATOCRIT: 39.2 % (ref 34.8–46.6)
HGB: 13.6 g/dL (ref 11.6–15.9)
LYMPH%: 38.7 % (ref 14.0–49.7)
MCH: 31.3 pg (ref 25.1–34.0)
MCHC: 34.7 g/dL (ref 31.5–36.0)
MCV: 90.1 fL (ref 79.5–101.0)
MONO#: 0.3 10*3/uL (ref 0.1–0.9)
MONO%: 8 % (ref 0.0–14.0)
NEUT%: 49.2 % (ref 38.4–76.8)
NEUTROS ABS: 1.9 10*3/uL (ref 1.5–6.5)
NRBC: 0 % (ref 0–0)
PLATELETS: 200 10*3/uL (ref 145–400)
RBC: 4.35 10*6/uL (ref 3.70–5.45)
RDW: 12.3 % (ref 11.2–14.5)
WBC: 3.9 10*3/uL (ref 3.9–10.3)
lymph#: 1.5 10*3/uL (ref 0.9–3.3)

## 2016-09-17 LAB — COMPREHENSIVE METABOLIC PANEL
ALT: 16 U/L (ref 0–55)
ANION GAP: 9 meq/L (ref 3–11)
AST: 16 U/L (ref 5–34)
Albumin: 4 g/dL (ref 3.5–5.0)
Alkaline Phosphatase: 46 U/L (ref 40–150)
BILIRUBIN TOTAL: 0.84 mg/dL (ref 0.20–1.20)
BUN: 11.3 mg/dL (ref 7.0–26.0)
CHLORIDE: 109 meq/L (ref 98–109)
CO2: 23 meq/L (ref 22–29)
CREATININE: 0.8 mg/dL (ref 0.6–1.1)
Calcium: 9.2 mg/dL (ref 8.4–10.4)
EGFR: >90 mL/min/{1.73_m2} (ref >90)
Glucose: 86 mg/dl (ref 70–140)
Potassium: 3.7 mEq/L (ref 3.5–5.1)
Sodium: 141 mEq/L (ref 136–145)
TOTAL PROTEIN: 7.8 g/dL (ref 6.4–8.3)

## 2016-09-17 MED ORDER — TAMOXIFEN CITRATE 20 MG PO TABS: 20.0000 mg | ORAL_TABLET | Freq: Every day | ORAL | 3 refills | Status: DC

## 2016-09-17 NOTE — Progress Notes (Signed)
Patient Care Team: No Pcp Per Patient as PCP - General (General Practice) Rolm Bookbinder, MD as Consulting Physician (General Surgery) Truitt Merle, MD as Consulting Physician (Hematology) Thea Silversmith, MD as Consulting Physician (Radiation Oncology) Mauro Kaufmann, RN as Registered Nurse Rockwell Germany, RN as Registered Nurse Sylvan Cheese, NP as Nurse Practitioner (Nurse Practitioner)  DIAGNOSIS: Breast cancer of upper-outer quadrant of right female breast [facility])   Staging form: Breast, AJCC 7th Edition   - Clinical stage from 09/20/2015: Stage IA (T1c, N0, M0) - Unsigned   - Pathologic stage from 11/04/2015: Stage IIA (T2(m), N0, cM0) - Signed by Enid Cutter, MD on 11/21/2015         Staging comments: Staged on final surgical specimen by Dr. Saralyn Pilar.  SUMMARY OF ONCOLOGIC HISTORY: Oncology History   Breast cancer of upper-outer quadrant of right female breast [facility])   Staging form: Breast, AJCC 7th Edition     Clinical stage from 09/20/2015: Stage IA (T1c, N0, M0) - Unsigned     Pathologic stage from 11/04/2015: Stage IIA (T2(m), N0, cM0) - Signed by Enid Cutter, MD on 11/21/2015       Staging comments: Staged on final surgical specimen by Dr. Saralyn Pilar.        Breast cancer of upper-outer quadrant of right female breast ([place])   09/19/2015 Mammogram    Right breast: 2.5 cm irregular high density mass with spiculated margin at 12:00, middle depth.  Further imaging needed.      09/20/2015 Breast US    Right breast: 2 lesions: 2.5 cm irregular mass at 12:00, middle depth.  0.5 cm circumscribed oval mass at 10:00, posterior depth, 6 cm from nipple      09/20/2015 Initial Biopsy    Right breast core needle biopsy 12:00 position showed invasive lobular carcinoma, second biopsy at 9:00 showed similar invasive lobular carcinoma, grade 1, ER+ (70%), PR+  (90%), HER-2 negative, Ki-67 20%      09/26/2015 Breast MRI    Breast MRI confirmed biopsy-proven right breast 2 cm lesion  at 12:00, and 1.3 cm lesion at 9:00 clock, and additional 0.9 cm non-mass enhancement at 6:00 position of the right breast, and oral enhancing mass at the 7:00 of the left breast measuring 0.7 cm      09/26/2015 Clinical Stage    Stage IA: T1c N0      09/29/2015 Procedure    Breast/Ovarian panel reveals no clinically significant variant at ATM, BARD1, BRCA1, BRCA2, BRIP1, CDH1, CHEK2, EPCAM, FANCC, MLH1, MSH2, MSH6, NBN, PALB2, PMS2, PTEN, RAD51C, RAD51D, TP53, and XRCC2      11/01/2015 Definitive Surgery    Bilateral mastectomy/R SLNB: RIGHT 2 foci invasive lobular carcinoma, 3.0 cm and 2.0 cm, grade 2, margins were negative, a total of 4 lymph nodes were negative (0/4). No lymphovascular invasion, LEFT: negative for malignancy, fibrocystic changes      11/01/2015 Pathologic Stage    Stage IIA: pT2(m), pN0      11/01/2015 Oncotype testing    Recurrence score 13, low risk, which predicts 10 year risk of distant recurrence 9% with tamoxifen alone.      12/29/2015 Survivorship    Survivorship visit completed; copy of care plan given to patient.      09/17/2016 -  Anti-estrogen oral therapy    Tamoxifen 20 mg daily, started late because initially she was not planning to take it but later changed her mind       CHIEF COMPLIANT: Follow-up to discuss  antiestrogen treatment  INTERVAL HISTORY: Amber Pugh is a 45 year old with above-mentioned history of right breast cancer treated with bilateral mastectomies. Oncotype DX was low risk and did not read chemotherapy.Her husband was undergoing treatment for rectal cancer and she decided to put off on starting antiestrogen therapy. She finally made up her mind regarding treatment and she wanted to see me to discuss treatment options. For the past couple of weeks she was having intermittent right lower chest wall/abdomen discomfort which is constant in nature rate 1-2 out of 10, no clear aggravating or relieving factors Tylenol does not seem to  help either.  REVIEW OF SYSTEMS:   Constitutional: Denies fevers, chills or abnormal weight loss Eyes: Denies blurriness of vision Ears, nose, mouth, throat, and face: Denies mucositis or sore throat Respiratory: Denies cough, dyspnea or wheezes Cardiovascular: Denies palpitation, chest discomfort Gastrointestinal: Right lower chest wall discomfort Skin: Denies abnormal skin rashes Lymphatics: Denies new lymphadenopathy or easy bruising Neurological:Denies numbness, tingling or new weaknesses Behavioral/Psych: Mood is stable, no new changes  Extremities: No lower extremity edema  All other systems were reviewed with the patient and are negative.  I have reviewed the past medical history, past surgical history, social history and family history with the patient and they are unchanged from previous note.  ALLERGIES:  is allergic to cefdinir and tetracyclines & related.  MEDICATIONS:  Current Outpatient Prescriptions  Medication Sig Dispense Refill  . acetaminophen (TYLENOL) 325 MG tablet Take 650 mg by mouth every 6 (six) hours as needed.    Marland Kitchen CALCIUM PO Take by mouth as needed.    . Cholecalciferol (VITAMIN D3) 1000 units CAPS Take 2 capsules by mouth daily.    . magnesium gluconate (MAGONATE) 500 MG tablet Take 500 mg by mouth 3 (three) times daily.    . Multiple Vitamins-Minerals (MULTIVITAMIN PO) Take by mouth daily.    Jonna Coup Leaf Extract 500 MG CAPS Take 1 capsule by mouth 2 (two) times daily.    . Probiotic Product (PROBIOTIC DAILY PO) Take 1 tablet by mouth daily. 25 million colonies    . tamoxifen (NOLVADEX) 20 MG tablet Take 1 tablet (20 mg total) by mouth daily. 90 tablet 3  . Turmeric 500 MG CAPS Take by mouth.    Marland Kitchen UNABLE TO FIND Rosemary , Tumeric, ginger  Two to Three times/day    . Zinc 30 MG CAPS Take 1 capsule by mouth daily. With Biotin     No current facility-administered medications for this visit.     PHYSICAL EXAMINATION: ECOG PERFORMANCE STATUS: 1 -  Symptomatic but completely ambulatory  Vitals:   09/17/16 0926  BP: 102/67  Pulse: (!) 57  Resp: 18  Temp: 98.5 F (36.9 C)   Filed Weights   09/17/16 0926  Weight: 156 lb 4.8 oz (70.9 kg)    GENERAL:alert, no distress and comfortable SKIN: skin color, texture, turgor are normal, no rashes or significant lesions EYES: normal, Conjunctiva are pink and non-injected, sclera clear OROPHARYNX:no exudate, no erythema and lips, buccal mucosa, and tongue normal  NECK: supple, thyroid normal size, non-tender, without nodularity LYMPH:  no palpable lymphadenopathy in the cervical, axillary or inguinal LUNGS: clear to auscultation and percussion with normal breathing effort HEART: regular rate & rhythm and no murmurs and no lower extremity edema ABDOMEN:abdomen soft, non-tender and normal bowel sounds MUSCULOSKELETAL:no cyanosis of digits and no clubbing  NEURO: alert & oriented x 3 with fluent speech, no focal motor/sensory deficits EXTREMITIES: No lower extremity edema  LABORATORY DATA:  I have reviewed the data as listed   Chemistry      Component Value Date/Time   NA 141 09/17/2016 1035   K 3.7 09/17/2016 1035   CL 101 10/27/2015 1300   CO2 23 09/17/2016 1035   BUN 11.3 09/17/2016 1035   CREATININE 0.8 09/17/2016 1035      Component Value Date/Time   CALCIUM 9.2 09/17/2016 1035   ALKPHOS 46 09/17/2016 1035   AST 16 09/17/2016 1035   ALT 16 09/17/2016 1035   BILITOT 0.84 09/17/2016 1035       Lab Results  Component Value Date   WBC 3.9 09/17/2016   HGB 13.6 09/17/2016   HCT 39.2 09/17/2016   MCV 90.1 09/17/2016   PLT 200 09/17/2016   NEUTROABS 1.9 09/17/2016     ASSESSMENT & PLAN:  Breast cancer of upper-outer quadrant of right female breast ([place]) 09/20/2015: Right breast: Multifocal invasive lobular carcinoma, mpT2N0M0, stage IIA, G2, ER+/PR+/HER2- 11/01/2015: Bilateral mastectomies, Right breast: 2 foci 3 cm, 2 cm, grade 2, 0/4 lymph nodes, ER 70%, PR 90%,  HER-2 negative ratio 1.29, Ki-67 20%; left breast:PASH Oncotype DX score 13, low risk, 9% ROR Genetic testing: Negative  Current treatment: Tamoxifen 20 mg daily started 09/17/2016  Tamoxifen counseling:We discussed the risks and benefits of tamoxifen. These include but not limited to insomnia, hot flashes, mood changes, vaginal dryness, and weight gain. Although rare, serious side effects including endometrial cancer, risk of blood clots were also discussed. We strongly believe that the benefits far outweigh the risks. Patient understands these risks and consented to starting treatment. Planned treatment duration is 10 years.  Right lower chest wall pain: I would like to obtain CT chest abdomen pelvis for further evaluation. I would like to see her back in 2 weeks to discuss his CT scans as well as tolerability to treatment.  Graves' disease: Follows with primary care physician and endocrinology Breast Cancer Surveillance: 1. Breast exam 09/17/2016: Benign 2. Mammogram no role of mammogram since she had bilateral mastectomies  [facility] and has undergone physical therapy. She takes turmeric and black pepper. She exercises frequently and stays very healthy.  Return to clinic in 6 months for follow-up    Orders Placed This Encounter  Procedures  . CT Chest W Contrast    Standing Status:   Future    Standing Expiration Date:   09/17/2017    Order Specific Question:   If indicated for the ordered procedure, I authorize the administration of contrast media per Radiology protocol    Answer:   Yes    Order Specific Question:   Reason for Exam (SYMPTOM  OR DIAGNOSIS REQUIRED)    Answer:   Right lower chest discomfort, Breast cancer    Order Specific Question:   Is patient pregnant?    Answer:   No    Order Specific Question:   Preferred imaging location?    Answer:   [facility]  . CT Abdomen Pelvis W Contrast    Standing Status:   Future     Standing Expiration Date:   09/17/2017    Order Specific Question:   If indicated for the ordered procedure, I authorize the administration of contrast media per Radiology protocol    Answer:   Yes    Order Specific Question:   Reason for Exam (SYMPTOM  OR DIAGNOSIS REQUIRED)    Answer:   Breast cancer with lower chest pain  Order Specific Question:   Is patient pregnant?    Answer:   No    Order Specific Question:   Preferred imaging location?    Answer:   [facility]  . CBC with Differential    Standing Status:   Future    Number of Occurrences:   1    Standing Expiration Date:   09/17/2017  . Comprehensive metabolic panel    Standing Status:   Future    Number of Occurrences:   1    Standing Expiration Date:   09/17/2017   The patient has a good understanding of the overall plan. she agrees with it. she will call with any problems that may develop before the next visit here.   Rulon Eisenmenger, MD 09/17/16

## 2016-09-17 NOTE — Assessment & Plan Note (Addendum)
09/20/2015: Right breast: Multifocal invasive lobular carcinoma, mpT2N0M0, stage IIA, G2, ER+/PR+/HER2- 11/01/2015: Bilateral mastectomies, Right breast: 2 foci 3 cm, 2 cm, grade 2, 0/4 lymph nodes, ER 70%, PR 90%, HER-2 negative ratio 1.29, Ki-67 20%; left breast:PASH Oncotype DX score 13, low risk, 9% ROR Genetic testing: Negative  Current treatment: Tamoxifen 20 mg daily started February 2017 Tamoxifen toxicities:  Graves' disease: Follows with primary care physician and endocrinology Breast Cancer Surveillance: 1. Breast exam 09/17/2016: Benign 2. Mammogram no role of mammogram since she had bilateral mastectomies  Return to clinic in 6 months for follow-up  

## 2016-09-18 ENCOUNTER — Ambulatory Visit (INDEPENDENT_AMBULATORY_CARE_PROVIDER_SITE_OTHER): Payer: BLUE CROSS/BLUE SHIELD | Admitting: Certified Nurse Midwife

## 2016-09-18 ENCOUNTER — Encounter: Payer: Self-pay | Admitting: Certified Nurse Midwife

## 2016-09-18 VITALS — BP 96/64 | HR 60 | Resp 10 | Ht 66.25 in | Wt 155.0 lb

## 2016-09-18 DIAGNOSIS — Z01419 Encounter for gynecological examination (general) (routine) without abnormal findings: Secondary | ICD-10-CM | POA: Diagnosis not present

## 2016-09-18 DIAGNOSIS — Z124 Encounter for screening for malignant neoplasm of cervix: Secondary | ICD-10-CM | POA: Diagnosis not present

## 2016-09-18 NOTE — Progress Notes (Signed)
45 y.o. G0P0000 Married  Caucasian Fe here for annual exam. Periods normal no issues with now. Has switched oncologist Dr. Chong Sicilian for the first time yesterday. She had been seeing the same one her spouse was seeing for colon cancer. She felt the visit was great and helped her understand her cancer better. She has been having chest wall pain, and has CT scan scheduled for evaluation. Will be having another breast US prior to starting Tamoxifen.  Mentally so much better. Spouse is actually better!  Has been working out and doing weight lifting and so is spouse as tolerated. They both have increased their energy and food tolerance is so much better. Seeing endocrine for Hyperthyroid, was not happy with  the visit plans to see Osteopath for another opinion and will advise if needs referral to Dr. Loanne Drilling.  Volunteering for breast cancer support and enjoying it now.Occasional vaginal dryness using apricot kernel oil with out problems. Sees PCP yearly. No other health issues today. [place] now!  Patient's last menstrual period was 09/15/2016 (exact date).          Sexually active: Yes.    The current method of family planning is none. Spouse had radiation, no sperm or seminal fluid   Exercising: Yes.    walk,bike, weights Smoker:  no  Health Maintenance: Pap:  09-14-15 neg HPV HR neg MMG:  10/06/15 Left breast biopsy, rt breast invasive  mammary carcinoma, waiting for fax of breast biopsy Colonoscopy:  none BMD:   none TDaP:  2009 Shingles: no Pneumonia: no Hep C and HIV: HIV neg yrs ago Labs: pcp Self breast exam: pt had mastectomy   reports that she has never smoked. She has never used smokeless tobacco. She reports that she does not drink alcohol or use drugs.  Past Medical History:  Diagnosis Date  . Abnormal Pap smear of cervix 6/02   +HPV HR, CIN1  . Anxiety   . Basal cell carcinoma   . Breast cancer ([place])   . Grave's disease    GRAVES DISEASE  . Graves disease   .  Graves disease 1999   she was treatmented with medications 3 times, in remission for the past 3 years ago  . Joint pain   . Melasma   . Migraines    without aura  . MVP (mitral valve prolapse)    neg cardio workup    Past Surgical History:  Procedure Laterality Date  . CRYOTHERAPY  3/01   CIN1  . MASTECTOMY W/ SENTINEL NODE BIOPSY Bilateral 11/01/2015   Procedure: BILATERAL TOTAL MASTECTOMY WITH RIGHT SENTINEL LYMPH NODE BIOPSY;  Surgeon: Rolm Bookbinder, MD;  Location: [facility];  Service: General;  Laterality: Bilateral;  . WISDOM TOOTH EXTRACTION      Current Outpatient Prescriptions  Medication Sig Dispense Refill  . acetaminophen (TYLENOL) 325 MG tablet Take 650 mg by mouth every 6 (six) hours as needed.    Marland Kitchen CALCIUM PO Take by mouth as needed.    . Cholecalciferol (VITAMIN D3) 1000 units CAPS Take 2 capsules by mouth daily.    . Multiple Vitamins-Minerals (MULTIVITAMIN PO) Take by mouth daily.    Jonna Coup Leaf Extract 500 MG CAPS Take 1 capsule by mouth 2 (two) times daily.    . Probiotic Product (PROBIOTIC DAILY PO) Take 1 tablet by mouth daily. 25 million colonies    . Turmeric 500 MG CAPS Take by mouth.    Marland Kitchen UNABLE TO FIND Rosemary , Tumeric, ginger  Two to Three times/day    . Zinc 30 MG CAPS Take 1 capsule by mouth daily. With Biotin    . tamoxifen (NOLVADEX) 20 MG tablet Take 1 tablet (20 mg total) by mouth daily. (Patient not taking: Reported on 09/18/2016) 90 tablet 3   No current facility-administered medications for this visit.     Family History  Problem Relation Age of Onset  . Hypertension Father   . Diabetes Father   . Skin cancer Father     unknown type  . Gout Brother   . Hypertension Brother   . Pulmonary embolism Maternal Grandfather   . Diabetes Maternal Grandfather   . Prostate cancer Other     MGMs brother dx in his 58s-80s    ROS:  Pertinent items are noted in HPI.  Otherwise, a comprehensive ROS was negative.  Exam:   BP  96/64   Pulse 60   Resp 10   Ht 5' 6.25" (1.683 m)   Wt 155 lb (70.3 kg)   LMP 09/15/2016 (Exact Date)   BMI 24.83 kg/m  Height: 5' 6.25" (168.3 cm) Ht Readings from Last 3 Encounters:  09/18/16 5' 6.25" (1.683 m)  03/13/16 5' 6.25" (1.683 m)  02/14/16 5\' 6"  (1.676 m)    General appearance: alert, cooperative and appears stated age Head: Normocephalic, without obvious abnormality, atraumatic Neck: no adenopathy, supple, symmetrical, trachea midline and thyroid normal to inspection and palpation and mild enlargement only. Lungs: clear to auscultation bilaterally Breasts: No axillary or supraclavicular adenopathy, bilateral mastectomy scarring minimal and no masses or tenderness noted Heart: regular rate and rhythm Abdomen: soft, non-tender; no masses,  no organomegaly Extremities: extremities normal, atraumatic, no cyanosis or edema Skin: Skin color, texture, turgor normal. No rashes or lesions Lymph nodes: Cervical, supraclavicular, and axillary nodes normal. No abnormal inguinal nodes palpated Neurologic: Grossly normal   Pelvic: External genitalia:  no lesions              Urethra:  normal appearing urethra with no masses, tenderness or lesions              Bartholin's and Skene's: normal                 Vagina: normal appearing vagina with normal color and discharge, no lesions              Cervix: no cervical motion tenderness, no lesions, nulliparous appearance and scant blood noted with pap              Pap taken: Yes.   Bimanual Exam:  Uterus:  normal size, contour, position, consistency, mobility, non-tender              Adnexa: normal adnexa and no mass, fullness, tenderness               Rectovaginal: Confirms               Anus:  normal sphincter tone, no lesions  Chaperone present: yes  A:  Well Woman with normal exam  Contraception none needed due to spouse cancer  Breast cancer under oncology follow up  Starting Tamoxifen soon  Graves disease history with  Endocrine management, may see Osteopath for management    P:   Reviewed health and wellness pertinent to exam Continue follow up with oncology as indicated. Discussed periods may stop on Tamoxifen and if they do and she has vaginal bleeding or spotting this needs to be evaluated. Patient voiced understanding.  Pap smear as above with HPV reflex  Reviewed importance of diet and exercise, Vitamin D and calcium in diet. Follow up with MD's as indicated . Take time for self and spouse to help with social stress.   return annually or prn  An After Visit Summary was printed and given to the patient.

## 2016-09-18 NOTE — Patient Instructions (Signed)

## 2016-09-21 LAB — IPS PAP TEST WITH REFLEX TO HPV

## 2016-09-23 NOTE — Progress Notes (Signed)
Encounter reviewed Rance Smithson, MD   

## 2016-09-24 ENCOUNTER — Telehealth: Payer: Self-pay | Admitting: Hematology and Oncology

## 2016-09-24 ENCOUNTER — Ambulatory Visit (HOSPITAL_COMMUNITY)
Admission: RE | Admit: 2016-09-24 | Discharge: 2016-09-24 | Disposition: A | Discharge status: Home / Self Care | Payer: BLUE CROSS/BLUE SHIELD | Source: Ambulatory Visit | Attending: Hematology and Oncology | Admitting: Hematology and Oncology

## 2016-09-24 DIAGNOSIS — Z9013 Acquired absence of bilateral breasts and nipples: Secondary | ICD-10-CM | POA: Diagnosis not present

## 2016-09-24 DIAGNOSIS — R0789 Other chest pain: Secondary | ICD-10-CM | POA: Diagnosis present

## 2016-09-24 DIAGNOSIS — C50411 Malignant neoplasm of upper-outer quadrant of right female breast: Secondary | ICD-10-CM | POA: Diagnosis not present

## 2016-09-24 MED ORDER — IOPAMIDOL (ISOVUE-300) INJECTION 61%
100.0000 mL | Freq: Once | INTRAVENOUS | Status: AC | PRN
  Administered 2016-09-24: 100 mL via INTRAVENOUS

## 2016-09-24 NOTE — Telephone Encounter (Signed)
I called the patient to discuss the CT scan results. There is no evidence of metastatic disease. I left a voicemail.

## 2016-10-01 ENCOUNTER — Encounter: Payer: Self-pay | Admitting: Hematology and Oncology

## 2016-10-01 ENCOUNTER — Ambulatory Visit (HOSPITAL_BASED_OUTPATIENT_CLINIC_OR_DEPARTMENT_OTHER): Payer: BLUE CROSS/BLUE SHIELD | Admitting: Hematology and Oncology

## 2016-10-01 DIAGNOSIS — C50411 Malignant neoplasm of upper-outer quadrant of right female breast: Secondary | ICD-10-CM

## 2016-10-01 DIAGNOSIS — Z79810 Long term (current) use of selective estrogen receptor modulators (SERMs): Secondary | ICD-10-CM

## 2016-10-01 DIAGNOSIS — C50811 Malignant neoplasm of overlapping sites of right female breast: Secondary | ICD-10-CM

## 2016-10-01 DIAGNOSIS — Z170 Estrogen receptor positive status [ER+]: Secondary | ICD-10-CM | POA: Diagnosis not present

## 2016-10-01 NOTE — Assessment & Plan Note (Signed)
09/20/2015: Right breast: Multifocal invasive lobular carcinoma, mpT2N0M0, stage IIA, G2, ER+/PR+/HER2- 11/01/2015: Bilateral mastectomies, Right breast: 2 foci 3 cm, 2 cm, grade 2, 0/4 lymph nodes, ER 70%, PR 90%, HER-2 negative ratio 1.29, Ki-67 20%; left breast:PASH Oncotype DX score 13, low risk, 9% ROR Genetic testing: Negative  Current treatment: Tamoxifen 20 mg daily started 09/17/2016 Planned treatment duration is 10 years. Tamoxifen toxicities:   Right lower chest wall pain: CT chest abdomen pelvis 09/24/2016: No evidence of metastatic disease  Graves' disease: Follows with primary care physician and endocrinology  Breast Cancer Surveillance: 1. Breast exam 09/17/2016: Benign 2. Mammogram no role of mammogram since she had bilateral mastectomies  Patient has been to [facility] and has undergone physical therapy. She takes turmeric and black pepper. She exercises frequently and stays very healthy.  Return to clinic in 6 months for follow-up

## 2016-10-01 NOTE — Progress Notes (Signed)
Patient Care Team: No Pcp Per Patient as PCP - General (General Practice) Rolm Bookbinder, MD as Consulting Physician (General Surgery) Truitt Merle, MD as Consulting Physician (Hematology) Thea Silversmith, MD as Consulting Physician (Radiation Oncology) Mauro Kaufmann, RN as Registered Nurse Rockwell Germany, RN as Registered Nurse Sylvan Cheese, NP as Nurse Practitioner (Nurse Practitioner)  DIAGNOSIS: Breast cancer of upper-outer quadrant of right female breast [facility])   Staging form: Breast, AJCC 7th Edition   - Clinical stage from 09/20/2015: Stage IA (T1c, N0, M0) - Unsigned   - Pathologic stage from 11/04/2015: Stage IIA (T2(m), N0, cM0) - Signed by Enid Cutter, MD on 11/21/2015         Staging comments: Staged on final surgical specimen by Dr. Saralyn Pilar.  SUMMARY OF ONCOLOGIC HISTORY: Oncology History   Breast cancer of upper-outer quadrant of right female breast [facility])   Staging form: Breast, AJCC 7th Edition     Clinical stage from 09/20/2015: Stage IA (T1c, N0, M0) - Unsigned     Pathologic stage from 11/04/2015: Stage IIA (T2(m), N0, cM0) - Signed by Enid Cutter, MD on 11/21/2015       Staging comments: Staged on final surgical specimen by Dr. Saralyn Pilar.        Breast cancer of upper-outer quadrant of right female breast ([place])   09/19/2015 Mammogram    Right breast: 2.5 cm irregular high density mass with spiculated margin at 12:00, middle depth.  Further imaging needed.      09/20/2015 Breast US    Right breast: 2 lesions: 2.5 cm irregular mass at 12:00, middle depth.  0.5 cm circumscribed oval mass at 10:00, posterior depth, 6 cm from nipple      09/20/2015 Initial Biopsy    Right breast core needle biopsy 12:00 position showed invasive lobular carcinoma, second biopsy at 9:00 showed similar invasive lobular carcinoma, grade 1, ER+ (70%), PR+  (90%), HER-2 negative, Ki-67 20%      09/26/2015 Breast MRI    Breast MRI confirmed biopsy-proven right breast 2 cm lesion  at 12:00, and 1.3 cm lesion at 9:00 clock, and additional 0.9 cm non-mass enhancement at 6:00 position of the right breast, and oral enhancing mass at the 7:00 of the left breast measuring 0.7 cm      09/26/2015 Clinical Stage    Stage IA: T1c N0      09/29/2015 Procedure    Breast/Ovarian panel reveals no clinically significant variant at ATM, BARD1, BRCA1, BRCA2, BRIP1, CDH1, CHEK2, EPCAM, FANCC, MLH1, MSH2, MSH6, NBN, PALB2, PMS2, PTEN, RAD51C, RAD51D, TP53, and XRCC2      11/01/2015 Definitive Surgery    Bilateral mastectomy/R SLNB: RIGHT 2 foci invasive lobular carcinoma, 3.0 cm and 2.0 cm, grade 2, margins were negative, a total of 4 lymph nodes were negative (0/4). No lymphovascular invasion, LEFT: negative for malignancy, fibrocystic changes      11/01/2015 Pathologic Stage    Stage IIA: pT2(m), pN0      11/01/2015 Oncotype testing    Recurrence score 13, low risk, which predicts 10 year risk of distant recurrence 9% with tamoxifen alone.      12/29/2015 Survivorship    Survivorship visit completed; copy of care plan given to patient.      09/17/2016 -  Anti-estrogen oral therapy    Tamoxifen 10 mg daily, started late because initially she was not planning to take it but later changed her mind       CHIEF COMPLIANT: Follow-up on tamoxifen  therapy  INTERVAL HISTORY: Amber Pugh is a 45 year old with above-mentioned history of right breast cancer who underwent bilateral mastectomies and came to see me in September for discussion regarding antiestrogen treatment options. After lengthy discussion was started on tamoxifen therapy at 10 mg daily. For the first week she had profound toxicities including nausea dizziness lightheadedness severe hot flashes and inability to concentrate. Over time the symptoms have improved slowly. She continues to have 1 or 2 hot flashes every day but is able to manage reasonably well. She wishes to stay on the 10 mg dosage for the time  being.  REVIEW OF SYSTEMS:   Constitutional: Denies fevers, chills or abnormal weight loss Eyes: Denies blurriness of vision Ears, nose, mouth, throat, and face: Denies mucositis or sore throat Respiratory: Denies cough, dyspnea or wheezes Cardiovascular: Denies palpitation, chest discomfort Gastrointestinal:  Denies nausea, heartburn or change in bowel habits Skin: Denies abnormal skin rashes Lymphatics: Denies new lymphadenopathy or easy bruising Neurological:Denies numbness, tingling or new weaknesses Behavioral/Psych: Mood is stable, no new changes  Extremities: No lower extremity edema Breast: Bilateral mastectomies All other systems were reviewed with the patient and are negative.  I have reviewed the past medical history, past surgical history, social history and family history with the patient and they are unchanged from previous note.  ALLERGIES:  is allergic to cefdinir and tetracyclines & related.  MEDICATIONS:  Current Outpatient Prescriptions  Medication Sig Dispense Refill  . acetaminophen (TYLENOL) 325 MG tablet Take 650 mg by mouth every 6 (six) hours as needed.    Marland Kitchen CALCIUM PO Take by mouth as needed.    . Cholecalciferol (VITAMIN D3) 1000 units CAPS Take 2 capsules by mouth daily.    . Multiple Vitamins-Minerals (MULTIVITAMIN PO) Take by mouth daily.    Jonna Coup Leaf Extract 500 MG CAPS Take 1 capsule by mouth 2 (two) times daily.    . Probiotic Product (PROBIOTIC DAILY PO) Take 1 tablet by mouth daily. 25 million colonies    . tamoxifen (NOLVADEX) 20 MG tablet Take 1 tablet (20 mg total) by mouth daily. (Patient not taking: Reported on 09/18/2016) 90 tablet 3  . Turmeric 500 MG CAPS Take by mouth.    Marland Kitchen UNABLE TO FIND Rosemary , Tumeric, ginger  Two to Three times/day    . Zinc 30 MG CAPS Take 1 capsule by mouth daily. With Biotin     No current facility-administered medications for this visit.     PHYSICAL EXAMINATION: ECOG PERFORMANCE STATUS: 1 - Symptomatic  but completely ambulatory  Vitals:   10/01/16 1015  BP: 109/68  Pulse: 76  Resp: 18  Temp: 98 F (36.7 C)   Filed Weights   10/01/16 1015  Weight: 155 lb 12.8 oz (70.7 kg)    GENERAL:alert, no distress and comfortable SKIN: skin color, texture, turgor are normal, no rashes or significant lesions EYES: normal, Conjunctiva are pink and non-injected, sclera clear OROPHARYNX:no exudate, no erythema and lips, buccal mucosa, and tongue normal  NECK: supple, thyroid normal size, non-tender, without nodularity LYMPH:  no palpable lymphadenopathy in the cervical, axillary or inguinal LUNGS: clear to auscultation and percussion with normal breathing effort HEART: regular rate & rhythm and no murmurs and no lower extremity edema ABDOMEN:abdomen soft, non-tender and normal bowel sounds MUSCULOSKELETAL:no cyanosis of digits and no clubbing  NEURO: alert & oriented x 3 with fluent speech, no focal motor/sensory deficits EXTREMITIES: No lower extremity edema  LABORATORY DATA:  I have reviewed the data as  listed   Chemistry      Component Value Date/Time   NA 141 09/17/2016 1035   K 3.7 09/17/2016 1035   CL 101 10/27/2015 1300   CO2 23 09/17/2016 1035   BUN 11.3 09/17/2016 1035   CREATININE 0.8 09/17/2016 1035      Component Value Date/Time   CALCIUM 9.2 09/17/2016 1035   ALKPHOS 46 09/17/2016 1035   AST 16 09/17/2016 1035   ALT 16 09/17/2016 1035   BILITOT 0.84 09/17/2016 1035       Lab Results  Component Value Date   WBC 3.9 09/17/2016   HGB 13.6 09/17/2016   HCT 39.2 09/17/2016   MCV 90.1 09/17/2016   PLT 200 09/17/2016   NEUTROABS 1.9 09/17/2016     ASSESSMENT & PLAN:  Breast cancer of upper-outer quadrant of right female breast ([place]) 09/20/2015: Right breast: Multifocal invasive lobular carcinoma, mpT2N0M0, stage IIA, G2, ER+/PR+/HER2- 11/01/2015: Bilateral mastectomies, Right breast: 2 foci 3 cm, 2 cm, grade 2, 0/4 lymph nodes, ER 70%, PR 90%, HER-2 negative  ratio 1.29, Ki-67 20%; left breast:PASH Oncotype DX score 13, low risk, 9% ROR Genetic testing: Negative  Current treatment: Tamoxifen 20 mg daily started 09/17/2016 Planned treatment duration is 10 years. Tamoxifen toxicities:  Initially patient had profound nausea, dizziness, lightheadedness, fatigue, hot flashes, inability to concentrate but they slowly improved over time.  Right lower chest wall pain: CT chest abdomen pelvis 09/24/2016: No evidence of metastatic disease  Graves' disease: Follows with primary care physician and endocrinology  Breast Cancer Surveillance: 1. Breast exam 09/17/2016: Benign 2. Mammogram no role of mammogram since she had bilateral mastectomies  Patient has been to [facility] and has undergone physical therapy. She takes turmeric and black pepper. She exercises frequently and stays very healthy.  Return to clinic in 3 months for follow-up   No orders of the defined types were placed in this encounter.  The patient has a good understanding of the overall plan. she agrees with it. she will call with any problems that may develop before the next visit here.   Rulon Eisenmenger, MD 10/01/16

## 2016-10-07 IMAGING — CT CT ABD-PELV W/ CM
3 of 5 series · 16 of 46 positions shown, 18 images · IV contrast (iopamidol)
Comparison: Breast MR of 09/23/2015

CLINICAL DATA: Right-sided lower chest pain for 1 month. History of
breast cancer with bilateral mastectomy. Right-sided primary breast
cancer. History of Graves disease.

EXAM:
CT CHEST, ABDOMEN, AND PELVIS WITH CONTRAST
TECHNIQUE: Multidetector CT imaging of the chest, abdomen and pelvis was
performed following the standard protocol during bolus
administration of intravenous contrast.
CONTRAST:  100mL 6ARAAS-Y99 IOPAMIDOL (6ARAAS-Y99) INJECTION 61%

[Series 2: cap with st · axial · 0.78mm/px · z∈[+750,+1276]mm · 11 of 127 slices shown, 13 images]
[im 11/127  soft-tissue]
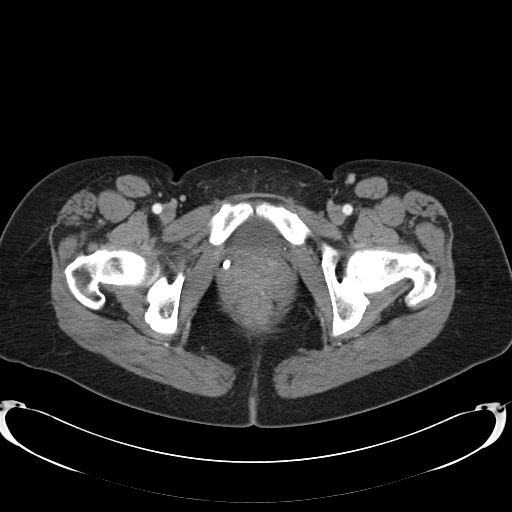
[im 11/127  bone]
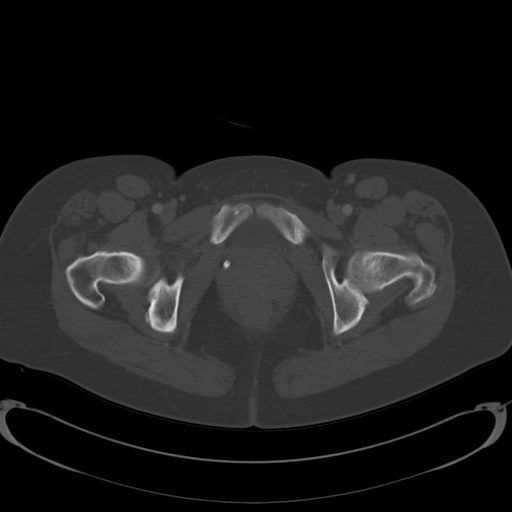
[im 22/127  soft-tissue]
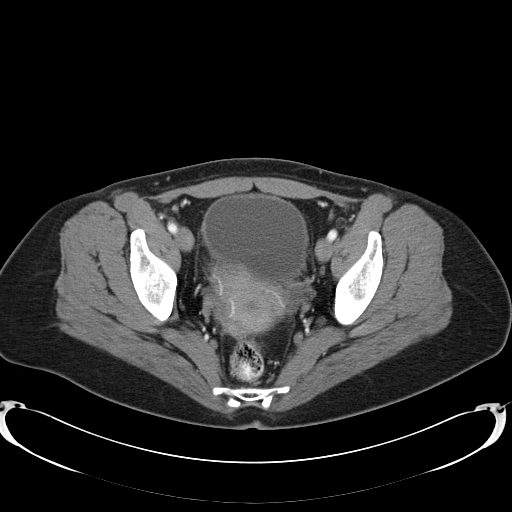
[im 32/127  soft-tissue]
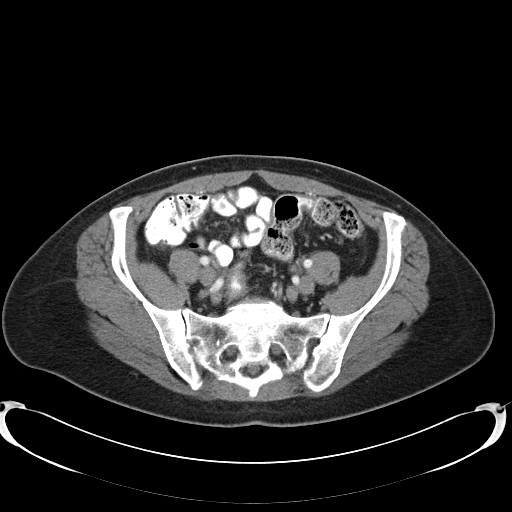
[im 43/127  soft-tissue]
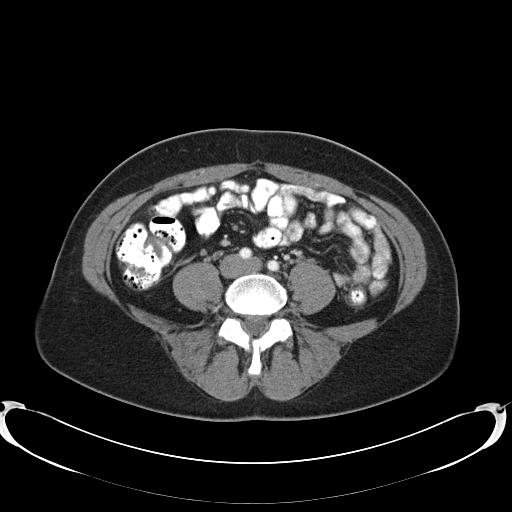
[im 53/127  soft-tissue]
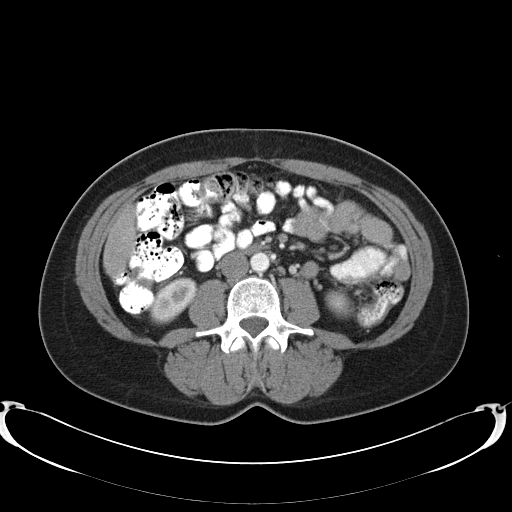
[im 64/127  soft-tissue]
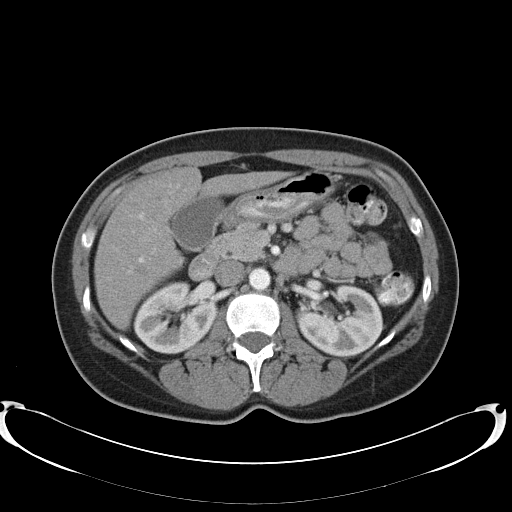
[im 74/127  soft-tissue]
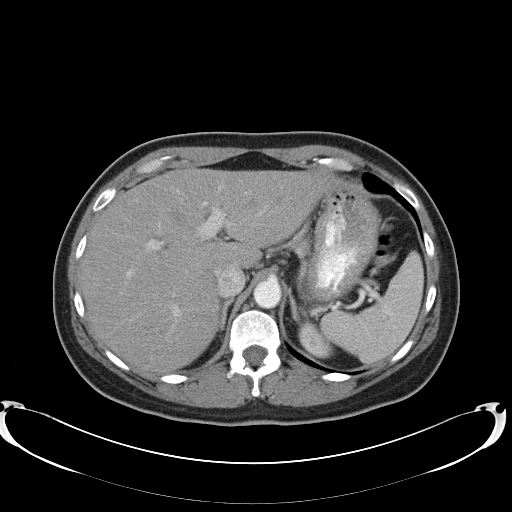
[im 85/127  soft-tissue]
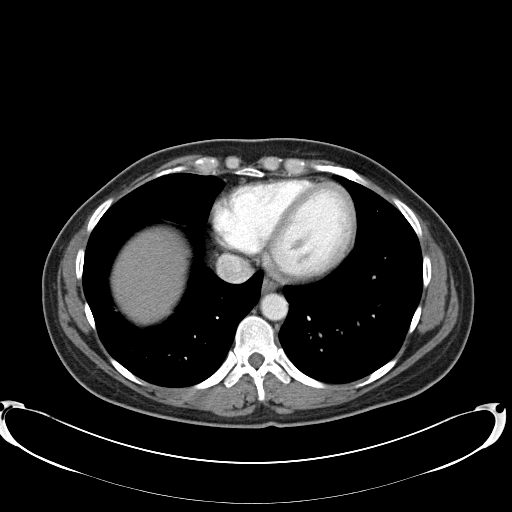
[im 95/127  soft-tissue]
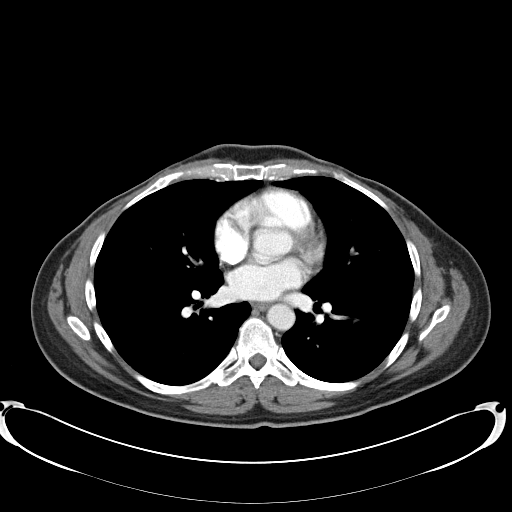
[im 95/127  bone]
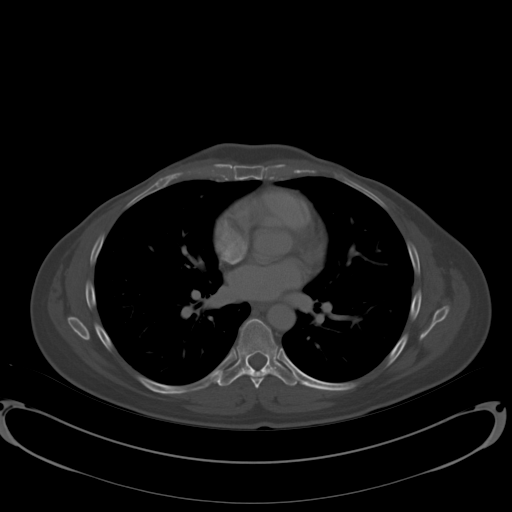
[im 106/127  soft-tissue]
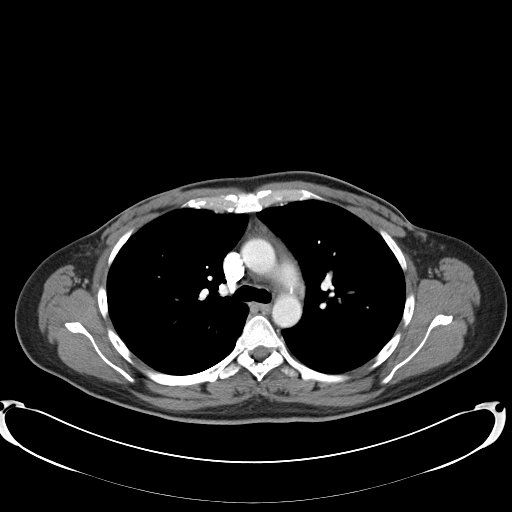
[im 116/127  soft-tissue]
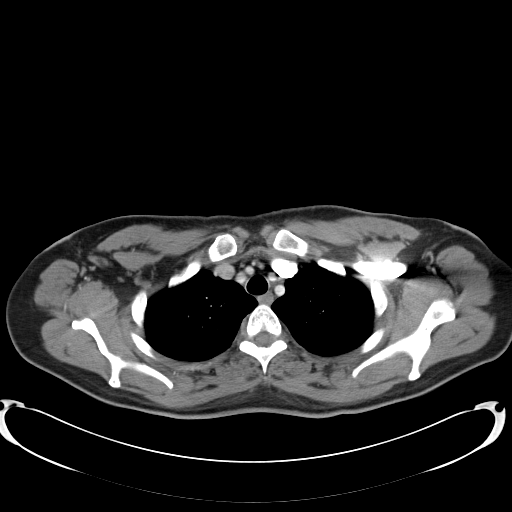

[Series 4: lung windows · axial · 0.78mm/px · z∈[+1058,+1100]mm · 2 of 147 slices shown]
[im 11/147  bone]
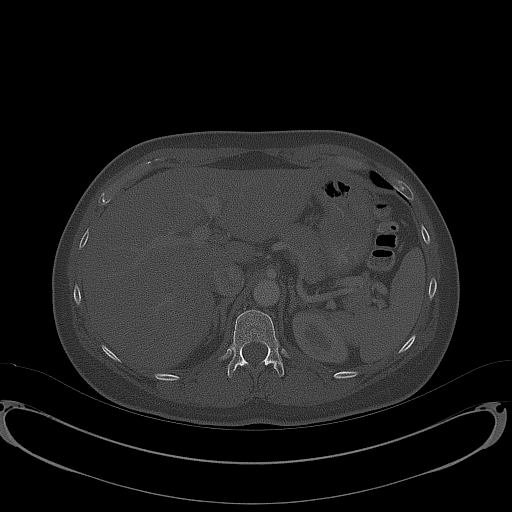
[im 32/147  bone]
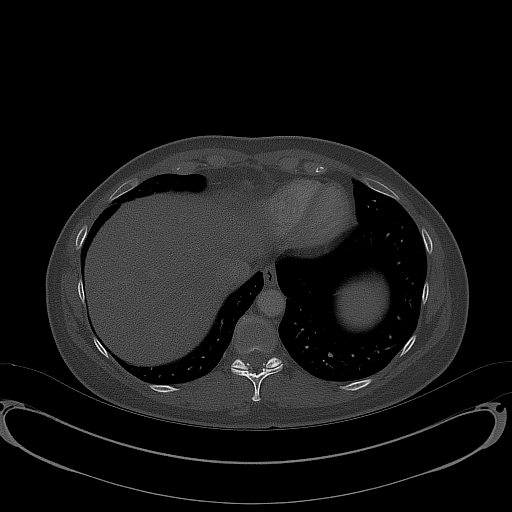

[Series 602: <mpr thick range> · coronal · 1.24mm/px · 3 of 89 slices shown]
[im 30/89  soft-tissue]
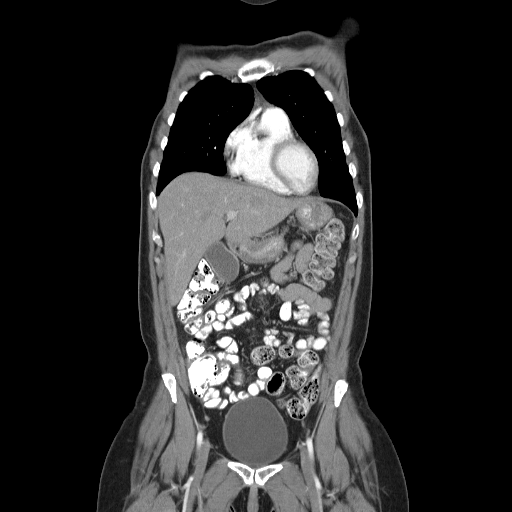
[im 40/89  soft-tissue]
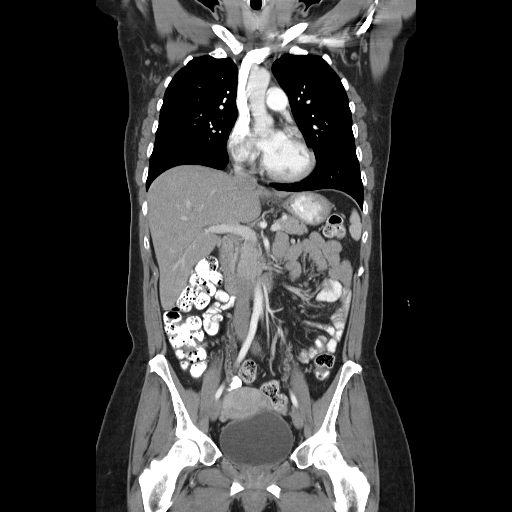
[im 49/89  soft-tissue]
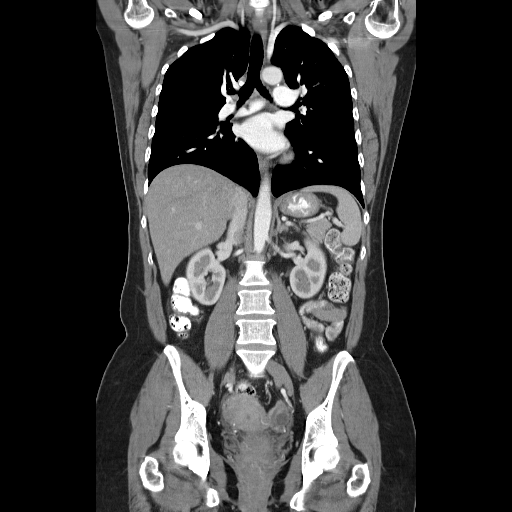

[16 of 46 positions shown; findings below may reference images not displayed]

FINDINGS: CT CHEST FINDINGS

Cardiovascular: Normal aortic caliber without dissection. Normal
heart size, without pericardial effusion. No central pulmonary
embolism, on this non-dedicated study.

Mediastinum/Nodes: No supraclavicular adenopathy. Bilateral
mastectomy. No axillary adenopathy. No mediastinal or hilar
adenopathy. Soft tissue density in the anterior mediastinum is
likely residual thymus. No internal mammary adenopathy.

Lungs/Pleura: No pleural fluid. Clear lungs.

Musculoskeletal: No acute osseous abnormality.

CT ABDOMEN PELVIS FINDINGS

Hepatobiliary: Normal liver. Normal gallbladder, without biliary
ductal dilatation.

Pancreas: Normal, without mass or ductal dilatation.

Spleen: Normal in size, without focal abnormality.

Adrenals/Urinary Tract: Normal adrenal glands. Interpolar left renal
too small to characterize lesion. Normal right kidney, without
hydronephrosis. Normal urinary bladder.

Stomach/Bowel: Normal stomach, without wall thickening. Normal
colon, appendix, and terminal ileum. Normal small bowel.

Vascular/Lymphatic: Normal caliber of the aorta and branch vessels.
No abdominopelvic adenopathy.

Reproductive: Normal uterus and adnexa.

Other: No significant free fluid. No evidence of omental or
peritoneal disease.

Musculoskeletal: No acute osseous abnormality.
IMPRESSION: Status post bilateral mastectomy. No evidence of metastatic disease
or explanation for right-sided chest pain.

## 2016-12-18 ENCOUNTER — Ambulatory Visit: Payer: BLUE CROSS/BLUE SHIELD | Admitting: Hematology and Oncology

## 2016-12-18 ENCOUNTER — Other Ambulatory Visit: Payer: BLUE CROSS/BLUE SHIELD

## 2016-12-25 ENCOUNTER — Other Ambulatory Visit: Payer: BLUE CROSS/BLUE SHIELD

## 2016-12-25 ENCOUNTER — Ambulatory Visit: Payer: BLUE CROSS/BLUE SHIELD | Admitting: Hematology

## 2017-02-15 ENCOUNTER — Encounter (HOSPITAL_COMMUNITY): Payer: Self-pay

## 2017-04-08 ENCOUNTER — Other Ambulatory Visit: Payer: BLUE CROSS/BLUE SHIELD

## 2017-04-08 ENCOUNTER — Ambulatory Visit: Payer: BLUE CROSS/BLUE SHIELD | Admitting: Hematology and Oncology

## 2017-04-08 NOTE — Assessment & Plan Note (Deleted)
09/20/2015: Right breast: Multifocal invasive lobular carcinoma, mpT2N0M0, stage IIA, G2, ER+/PR+/HER2- 11/01/2015: Bilateral mastectomies, Right breast: 2 foci 3 cm, 2 cm, grade 2, 0/4 lymph nodes, ER 70%, PR 90%, HER-2 negative ratio 1.29, Ki-67 20%; left breast:PASH Oncotype DX score 13, low risk, 9% ROR Genetic testing: Negative  Current treatment: Tamoxifen 20 mg daily started 09/17/2016 Planned treatment duration is 10years. Tamoxifen toxicities:  Initially patient had profound nausea, dizziness, lightheadedness, fatigue, hot flashes, inability to concentrate but they slowly improved over time.  Right lower chest wall pain:CT chest abdomen pelvis 09/24/2016: No evidence of metastatic disease  Graves' disease: Follows with primary care physician and endocrinology  Breast Cancer Surveillance: 1. Breast exam 04/08/2017: Benign 2. Mammogram no role of mammogram since she had bilateral mastectomies  Patient has been to integr[facility] medicine center and has undergone physical therapy. She takes turmeric and black pepper. She exercises frequently and stays very healthy.  Return to clinic in 6 months for follow-up

## 2017-09-20 ENCOUNTER — Other Ambulatory Visit (HOSPITAL_COMMUNITY)
Admission: RE | Admit: 2017-09-20 | Discharge: 2017-09-20 | Disposition: A | Discharge status: Home / Self Care | Payer: BLUE CROSS/BLUE SHIELD | Source: Ambulatory Visit | Attending: Certified Nurse Midwife | Admitting: Certified Nurse Midwife

## 2017-09-20 ENCOUNTER — Ambulatory Visit (INDEPENDENT_AMBULATORY_CARE_PROVIDER_SITE_OTHER): Payer: BLUE CROSS/BLUE SHIELD | Admitting: Certified Nurse Midwife

## 2017-09-20 ENCOUNTER — Encounter: Payer: Self-pay | Admitting: Certified Nurse Midwife

## 2017-09-20 VITALS — BP 110/68 | HR 72 | Resp 16 | Ht 66.25 in | Wt 161.0 lb

## 2017-09-20 DIAGNOSIS — Z853 Personal history of malignant neoplasm of breast: Secondary | ICD-10-CM | POA: Diagnosis not present

## 2017-09-20 DIAGNOSIS — R350 Frequency of micturition: Secondary | ICD-10-CM

## 2017-09-20 DIAGNOSIS — Z124 Encounter for screening for malignant neoplasm of cervix: Secondary | ICD-10-CM | POA: Insufficient documentation

## 2017-09-20 DIAGNOSIS — Z01419 Encounter for gynecological examination (general) (routine) without abnormal findings: Secondary | ICD-10-CM

## 2017-09-20 DIAGNOSIS — Z0000 Encounter for general adult medical examination without abnormal findings: Secondary | ICD-10-CM

## 2017-09-20 LAB — POCT URINALYSIS DIPSTICK
Bilirubin, UA: NEGATIVE
Glucose, UA: NEGATIVE
Ketones, UA: NEGATIVE
LEUKOCYTES UA: NEGATIVE
Nitrite, UA: NEGATIVE
PH UA: 5 (ref 5.0–8.0)
PROTEIN UA: NEGATIVE
UROBILINOGEN UA: NEGATIVE U/dL — AB

## 2017-09-20 NOTE — Progress Notes (Signed)
46 y.o. G0P0000 Married  Caucasian Fe here for annual exam. Periods normal, no issues. Working on weight loss with exercise daily with walk and weight work out and no weight loss. Has increased protein intake, some portion change she thinks, eats out of the bowl.  Using good fats in her diet. Has now added cardio too. Feels better as for strength. Sees PCP for aex and labs, all were normal. Graves disease no progression or flares. Continues with Oncology follow up for bilateral breast cancer with Dr. Chong Sicilian. Stopped Tamoxifen due to hot flashes. All stable at this point.Marland Kitchen Spouse doing great since colon cancer, no progression since surgery. "Feels very Blessed". Some urinary frequency with exercise only, no UTI symptoms otherwise.. No other health issues today.  Patient's last menstrual period was 09/08/2017 (exact date).          Sexually active: Yes.    The current method of family planning is none.    Exercising: Yes.    walk, weights Smoker:  no  Health Maintenance: Pap:  09-14-15 neg HPV HR neg, 09-18-16 ASCUS HPV HR neg History of Abnormal Pap: cryo MMG:  2016 breast cancer see reports bilateral mastectomy Self Breast exams: yes Colonoscopy:  none BMD:   Had done long time ago TDaP:  2009 Shingles: no Pneumonia: no Hep C and HIV: HIV neg yrs ago, hep c pt believes it was checked before Labs: if needed   reports that she has quit smoking. She has never used smokeless tobacco. She reports that she does not drink alcohol or use drugs.  Past Medical History:  Diagnosis Date  . Abnormal Pap smear of cervix 6/02   +HPV HR, CIN1  . Anxiety   . Basal cell carcinoma   . Breast cancer ([place])   . Grave's disease    GRAVES DISEASE  . Graves disease   . Graves disease 1999   she was treatmented with medications 3 times, in remission for the past 3 years ago  . Joint pain   . Melasma   . Migraines    without aura  . MVP (mitral valve prolapse)    neg cardio workup    Past Surgical  History:  Procedure Laterality Date  . CRYOTHERAPY  3/01   CIN1  . MASTECTOMY W/ SENTINEL NODE BIOPSY Bilateral 11/01/2015   Procedure: BILATERAL TOTAL MASTECTOMY WITH RIGHT SENTINEL LYMPH NODE BIOPSY;  Surgeon: Rolm Bookbinder, MD;  Location: [place];  Service: General;  Laterality: Bilateral;  . WISDOM TOOTH EXTRACTION      Current Outpatient Prescriptions  Medication Sig Dispense Refill  . CALCIUM PO Take by mouth as needed.    . Cholecalciferol (VITAMIN D3) 1000 units CAPS Take 2 capsules by mouth daily.    . Multiple Vitamins-Minerals (MULTIVITAMIN PO) Take by mouth daily.    . Probiotic Product (PROBIOTIC DAILY PO) Take 1 tablet by mouth daily. 25 million colonies    . UNABLE TO FIND Rosemary , Tumeric, ginger  Two to Three times/day     No current facility-administered medications for this visit.     Family History  Problem Relation Age of Onset  . Hypertension Father   . Diabetes Father   . Skin cancer Father        unknown type  . Gout Brother   . Hypertension Brother   . Pulmonary embolism Maternal Grandfather   . Diabetes Maternal Grandfather   . Prostate cancer Other        MGMs brother  dx in his 70s-80s    ROS:  Pertinent items are noted in HPI.  Otherwise, a comprehensive ROS was negative.  Exam:   BP 110/68   Pulse 72   Resp 16   Ht 5' 6.25" (1.683 m)   Wt 161 lb (73 kg)   LMP 09/08/2017 (Exact Date)   BMI 25.79 kg/m  Height: 5' 6.25" (168.3 cm) Ht Readings from Last 3 Encounters:  09/20/17 5' 6.25" (1.683 m)  10/01/16 5' 6.25" (1.683 m)  09/18/16 5' 6.25" (1.683 m)    General appearance: alert, cooperative and appears stated age Head: Normocephalic, without obvious abnormality, atraumatic Neck: no adenopathy, supple, symmetrical, trachea midline and thyroid normal to inspection and palpation Lungs: clear to auscultation bilaterally Breasts: normal appearance, no masses or tenderness, No nipple retraction or dimpling, No nipple  discharge or bleeding, No axillary or supraclavicular adenopathy Heart: regular rate and rhythm Abdomen: soft, non-tender; no masses,  no organomegaly Extremities: extremities normal, atraumatic, no cyanosis or edema Skin: Skin color, texture, turgor normal. No rashes or lesions Lymph nodes: Cervical, supraclavicular, and axillary nodes normal. No abnormal inguinal nodes palpated Neurologic: Grossly normal   Pelvic: External genitalia:  no lesions              Urethra:  normal appearing urethra with no masses, tenderness or lesions              Bartholin's and Skene's: normal                 Vagina: normal appearing vagina with normal color and discharge, no lesions              Cervix: no cervical motion tenderness, no lesions and nulliparous appearance              Pap taken: Yes.   Bimanual Exam:  Uterus:  normal size, contour, position, consistency, mobility, non-tender              Adnexa: normal adnexa and no mass, fullness, tenderness               Rectovaginal: Confirms               Anus:  normal sphincter tone, no lesions  Chaperone present: yes  A:  Well Woman with normal exam  Contraception none needed, spouse infertility due to cancer treatment  Bilateral breast cancer in 2016, not on tamoxifen now, still under oncology follow up, last scan was negative  Graves disease history with PCP follow up and labs  Weight training and weight management now   Emotionally improved with spouse colon cancer in remission and no medication use now  R/O UTI RBC tr  ASCUS Pap negative HPVHR follow up  P:   Reviewed health and wellness pertinent to exam  Continue chest wall scarring exams and follow up with onocology  Continue MD follow up as indicated  Encouraged to change her protein shakes to evening and heavier meal in am to see if this changes weight pattern. Will advise.  Warning signs of UTI given  Lab Urine micro  Pap smear: yes if negative repeat one year, if not per  results   counseled on breast self exam, adequate intake of calcium and vitamin D, diet and exercise, Kegel's exercises  return annually or prn  An After Visit Summary was printed and given to the patient.

## 2017-09-20 NOTE — Patient Instructions (Signed)

## 2017-09-20 NOTE — Addendum Note (Signed)
Addended by: Susy Manor on: 09/20/2017 01:46 PM   Modules accepted: Orders

## 2017-09-21 LAB — URINALYSIS, MICROSCOPIC ONLY
Bacteria, UA: NONE SEEN
CASTS: NONE SEEN /LPF
RBC, UA: NONE SEEN /hpf (ref 0–?)

## 2017-09-24 LAB — CYTOLOGY - PAP
DIAGNOSIS: NEGATIVE
HPV (WINDOPATH): NOT DETECTED

## 2017-11-26 DIAGNOSIS — H9313 Tinnitus, bilateral: Secondary | ICD-10-CM | POA: Insufficient documentation

## 2018-09-30 ENCOUNTER — Ambulatory Visit: Payer: BLUE CROSS/BLUE SHIELD | Admitting: Certified Nurse Midwife

## 2018-12-10 ENCOUNTER — Telehealth: Payer: Self-pay

## 2018-12-10 NOTE — Telephone Encounter (Signed)
Patient in 08 recall. Due for pap smear; hx of ASCUS and breast cancer. Please contact patient to schedule. Last AEX 09/20/17.

## 2018-12-11 NOTE — Telephone Encounter (Signed)
appt scheduled for Tuesday 12-16-18 at 3:45pm for aex. Routing to [employer]

## 2018-12-16 ENCOUNTER — Ambulatory Visit (INDEPENDENT_AMBULATORY_CARE_PROVIDER_SITE_OTHER): Payer: BLUE CROSS/BLUE SHIELD | Admitting: Certified Nurse Midwife

## 2018-12-16 ENCOUNTER — Other Ambulatory Visit: Payer: Self-pay

## 2018-12-16 ENCOUNTER — Other Ambulatory Visit (HOSPITAL_COMMUNITY)
Admission: RE | Admit: 2018-12-16 | Discharge: 2018-12-16 | Disposition: A | Discharge status: Home / Self Care | Payer: BLUE CROSS/BLUE SHIELD | Source: Ambulatory Visit | Attending: Certified Nurse Midwife | Admitting: Certified Nurse Midwife

## 2018-12-16 ENCOUNTER — Encounter: Payer: Self-pay | Admitting: Certified Nurse Midwife

## 2018-12-16 VITALS — BP 94/60 | HR 68 | Resp 16 | Ht 66.0 in | Wt 163.0 lb

## 2018-12-16 DIAGNOSIS — Z124 Encounter for screening for malignant neoplasm of cervix: Secondary | ICD-10-CM | POA: Insufficient documentation

## 2018-12-16 DIAGNOSIS — Z23 Encounter for immunization: Secondary | ICD-10-CM

## 2018-12-16 DIAGNOSIS — Z01419 Encounter for gynecological examination (general) (routine) without abnormal findings: Secondary | ICD-10-CM | POA: Diagnosis not present

## 2018-12-16 NOTE — Progress Notes (Signed)
47 y.o. G0P0000 Married  Caucasian Fe here for annual exam. Periods are still painful with increase in bleeding the first 2-3 days. Uses one tampon every 2-3 hours.If uses pads less amount. Out of oncology follow up now, not taking tamoxifen now. Spouse last check up disease free now! Sees Osteopath for labs and medication if needed, but has retired. Looking for new MD. No other health issues today.  Patient's last menstrual period was 12/05/2018 (exact date).          Sexually active: Yes.    The current method of family planning is none.    Exercising: Yes.    weights & running Smoker:  no  Review of Systems  Constitutional: Negative.   HENT: Negative.   Eyes: Negative.   Respiratory: Negative.   Cardiovascular: Negative.   Gastrointestinal: Negative.   Genitourinary: Negative.   Musculoskeletal: Negative.   Skin: Negative.   Neurological: Negative.   Endo/Heme/Allergies: Negative.   Psychiatric/Behavioral: Negative.     Health Maintenance: Pap:  09-18-16 ASCUS HPV HR neg, 09-20-17 neg HPV HR neg History of Abnormal Pap: cryo MMG: 2016 breast cancer, bilateral mastectomy Self Breast exams: yes Colonoscopy:  none BMD:   Done long time ago TDaP:  2009 Shingles: no Pneumonia: no Hep C and HIV: both neg per patient Labs: if needed   reports that she has quit smoking. She has never used smokeless tobacco. She reports that she does not drink alcohol or use drugs.  Past Medical History:  Diagnosis Date  . Abnormal Pap smear of cervix 6/02   +HPV HR, CIN1  . Anxiety   . Basal cell carcinoma   . Breast cancer ([place])   . Grave's disease    GRAVES DISEASE  . Graves disease   . Graves disease 1999   she was treatmented with medications 3 times, in remission for the past 3 years ago  . Joint pain   . Melasma   . Migraines    without aura  . MVP (mitral valve prolapse)    neg cardio workup    Past Surgical History:  Procedure Laterality Date  . CRYOTHERAPY  3/01   CIN1   . MASTECTOMY W/ SENTINEL NODE BIOPSY Bilateral 11/01/2015   Procedure: BILATERAL TOTAL MASTECTOMY WITH RIGHT SENTINEL LYMPH NODE BIOPSY;  Surgeon: Rolm Bookbinder, MD;  Location: [place];  Service: General;  Laterality: Bilateral;  . WISDOM TOOTH EXTRACTION      Current Outpatient Medications  Medication Sig Dispense Refill  . CALCIUM PO Take by mouth as needed.    . Cholecalciferol (VITAMIN D3) 1000 units CAPS Take 2 capsules by mouth daily.    . Multiple Vitamins-Minerals (MULTIVITAMIN PO) Take by mouth daily.    . Probiotic Product (PROBIOTIC DAILY PO) Take 1 tablet by mouth daily. 25 million colonies    . UNABLE TO FIND Rosemary , Tumeric, ginger  Two to Three times/day     No current facility-administered medications for this visit.     Family History  Problem Relation Age of Onset  . Hypertension Father   . Diabetes Father   . Skin cancer Father        unknown type  . Gout Brother   . Hypertension Brother   . Pulmonary embolism Maternal Grandfather   . Diabetes Maternal Grandfather   . Prostate cancer Other        MGMs brother dx in his 41s-80s    ROS:  Pertinent items are noted in HPI.  Otherwise, a comprehensive ROS was negative.  Exam:   BP 94/60   Pulse 68   Resp 16   Ht 5\' 6"  (1.676 m)   Wt 163 lb (73.9 kg)   LMP 12/05/2018 (Exact Date)   BMI 26.31 kg/m  Height: 5\' 6"  (167.6 cm) Ht Readings from Last 3 Encounters:  12/16/18 5\' 6"  (1.676 m)  09/20/17 5' 6.25" (1.683 m)  10/01/16 5' 6.25" (1.683 m)    General appearance: alert, cooperative and appears stated age Head: Normocephalic, without obvious abnormality, atraumatic Neck: no adenopathy, supple, symmetrical, trachea midline and thyroid normal to inspection and palpation Lungs: clear to auscultation bilaterally Breasts: normal appearance, no masses or tenderness, scars bilaterally no masses or unusual feel. Heart: regular rate and rhythm Abdomen: soft, non-tender; no masses,  no  organomegaly Extremities: extremities normal, atraumatic, no cyanosis or edema Skin: Skin color, texture, turgor normal. No rashes or lesions Lymph nodes: Cervical, supraclavicular, and axillary nodes normal. No abnormal inguinal nodes palpated Neurologic: Grossly normal   Pelvic: External genitalia:  no lesions, normal appearance              Urethra:  normal appearing urethra with no masses, tenderness or lesions              Bartholin's and Skene's: normal                 Vagina: normal appearing vagina with normal color and discharge, no lesions              Cervix: no cervical motion tenderness and no lesions              Pap taken: Yes.   Bimanual Exam:  Uterus:  normal size, contour, position, consistency, mobility, non-tender and anteverted              Adnexa: normal adnexa and no mass, fullness, tenderness               Rectovaginal: Confirms               Anus:  normal sphincter tone, no lesions  Chaperone present: yes  A:  Well Woman with normal exam  Contraception none  History of Right breast cancer with bilateral mastectomy out of follow up with Oncology  Spouse disease free now  Immunization update    P:   Reviewed health and wellness pertinent to exam  Discussed period warning signs of heavy bleeding or cramping and need to advise.  Continue follow up as indicated with PCP  Requests TDAP.  Pap smear: yes   counseled on breast self exam, mammography screening if indicated(no), adequate intake of calcium and vitamin D, diet and exercise.  return annually or prn  An After Visit Summary was printed and given to the patient.

## 2018-12-16 NOTE — Patient Instructions (Signed)

## 2018-12-22 LAB — CYTOLOGY - PAP
DIAGNOSIS: NEGATIVE
HPV (WINDOPATH): NOT DETECTED

## 2019-12-16 ENCOUNTER — Other Ambulatory Visit: Payer: Self-pay

## 2019-12-17 ENCOUNTER — Telehealth: Payer: Self-pay | Admitting: *Deleted

## 2019-12-17 NOTE — Telephone Encounter (Signed)
Return call to patient. She called business office yesterday requesting form completion for prior auth of annual exam and Pap smear scheduled for 12-18-19. Reviewed changing Pap guidelines and that routine pap may not be covered annually. Will need to review with provider and determine how to proceed.   Routing to provider- FYI.   Encounter closed.

## 2019-12-18 ENCOUNTER — Encounter: Payer: BC Managed Care – PPO | Admitting: Certified Nurse Midwife

## 2019-12-18 ENCOUNTER — Other Ambulatory Visit: Payer: Self-pay

## 2019-12-18 NOTE — Progress Notes (Signed)
48 y.o. G0P0000 Married  Caucasian Fe here for annual exam. Patient not seen today due to elevated temperature. Temp taken 3 times. Forehead temp 99.2, 99.3. Oral temp 99.1. Per patient no symptoms at all & feels fine. Patient aware to go home & keep a check on her temp. Patient to call & reschedule next week if no temp or symptoms. If symptoms start, patient is to go get covid testing & quarantine for 2 weeks.    Information discussed with CMA and agreeable with plan.

## 2020-03-18 ENCOUNTER — Encounter: Payer: Self-pay | Admitting: Certified Nurse Midwife

## 2020-12-19 ENCOUNTER — Ambulatory Visit: Payer: BC Managed Care – PPO | Admitting: Certified Nurse Midwife

## 2023-05-12 NOTE — Progress Notes (Unsigned)
New Patient Note  RE: Amber Pugh MRN: [id number] DOB: 10-Oct-1971 Date of Office Visit: 05/13/2023  Consult requested by: Koren Shiver, DO Primary care provider: Koren Shiver, DO  Chief Complaint: No chief complaint on file.  History of Present Illness: I had the pleasure of seeing Amber Pugh for initial evaluation at the [facility] on 05/12/2023. She is a 52 y.o. female, who is referred here by Koren Shiver, DO for the evaluation of ***.  ***  Assessment and Plan: Amber Pugh is a 52 y.o. female with: No problem-specific Assessment & Plan notes found for this encounter.  No follow-ups on file.  No orders of the defined types were placed in this encounter.  Lab Orders  No laboratory test(s) ordered today    Other allergy screening: Asthma: {Blank single:19197::"yes","no"} Rhino conjunctivitis: {Blank single:19197::"yes","no"} Food allergy: {Blank single:19197::"yes","no"} Medication allergy: {Blank single:19197::"yes","no"} Hymenoptera allergy: {Blank single:19197::"yes","no"} Urticaria: {Blank single:19197::"yes","no"} Eczema:{Blank single:19197::"yes","no"} History of recurrent infections suggestive of immunodeficency: {Blank single:19197::"yes","no"}  Diagnostics: Spirometry:  Tracings reviewed. Her effort: {Blank single:19197::"Good reproducible efforts.","It was hard to get consistent efforts and there is a question as to whether this reflects a maximal maneuver.","Poor effort, data can not be interpreted."} FVC: ***L FEV1: ***L, ***% predicted FEV1/FVC ratio: ***% Interpretation: {Blank single:19197::"Spirometry consistent with mild obstructive disease","Spirometry consistent with moderate obstructive disease","Spirometry consistent with severe obstructive disease","Spirometry consistent with possible restrictive disease","Spirometry consistent with mixed obstructive and restrictive disease","Spirometry  uninterpretable due to technique","Spirometry consistent with normal pattern","No overt abnormalities noted given today's efforts"}.  Please see scanned spirometry results for details.  Skin Testing: {Blank single:19197::"Select foods","Environmental allergy panel","Environmental allergy panel and select foods","Food allergy panel","None","Deferred due to recent antihistamines use"}. *** Results discussed with patient/family.   Past Medical History: Patient Active Problem List   Diagnosis Date Noted  . 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 Genetic testing 10/06/2015  . 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 Breast cancer of upper-outer quadrant of right female breast ([facility]) 09/23/2015  . Graves' disease    Past Medical History:  Diagnosis Date  . Abnormal Pap smear of cervix 6/02   +HPV HR, CIN1  . Anxiety   . Basal cell carcinoma   . Breast cancer ([facility])   . Grave's disease    GRAVES DISEASE  . Graves disease   . Graves disease 1999   she was treatmented with medications 3 times, in remission for the past 3 years ago  . Joint pain   . Melasma   . Migraines    without aura  . MVP (mitral valve prolapse)    neg cardio workup   Past Surgical History: Past Surgical History:  Procedure Laterality Date  . CRYOTHERAPY  3/01   CIN1  . MASTECTOMY W/ SENTINEL NODE BIOPSY Bilateral 11/01/2015   Procedure: BILATERAL TOTAL MASTECTOMY WITH RIGHT SENTINEL LYMPH NODE BIOPSY;  Surgeon: Emelia Loron, MD;  Location: [facility];  Service: General;  Laterality: Bilateral;  . WISDOM TOOTH EXTRACTION     Medication List:  Current Outpatient Medications  Medication Sig Dispense Refill  . CALCIUM PO Take by mouth as needed.    . Cholecalciferol (VITAMIN D3) 1000 units CAPS Take 2 capsules by mouth daily.    . Multiple Vitamins-Minerals (MULTIVITAMIN PO) Take by mouth daily.    . Probiotic Product (PROBIOTIC DAILY PO) Take 1 tablet by mouth daily. 25 million  colonies    . UNABLE TO FIND Rosemary , Tumeric, ginger  Two to  Three times/day     No current facility-administered medications for this visit.   Allergies: Allergies  Allergen Reactions  . Cefdinir     GI upset, nausea,vomiting & diarrhea ([employer])  . Tetracyclines & Related Other (See Comments)    Headache,nausea, stomach ulcer   Social History: Social History   Socioeconomic History  . Marital status: Married    Spouse name: Not on file  . Number of children: Not on file  . Years of education: Not on file  . Highest education level: Not on file  Occupational History  . Not on file  Tobacco Use  . Smoking status: Former  . Smokeless tobacco: Never  Substance and Sexual Activity  . Alcohol use: No    Alcohol/week: 0.0 standard drinks of alcohol  . Drug use: No  . Sexual activity: Yes    Partners: Male    Birth control/protection: None  Other Topics Concern  . Not on file  Social History Narrative  . Not on file   Social Determinants of Health   Financial Resource Strain: Not on file  Food Insecurity: Not on file  Transportation Needs: Not on file  Physical Activity: Not on file  Stress: Not on file  Social Connections: Not on file   Lives in a ***. Smoking: *** Occupation: ***  Environmental HistorySurveyor, minerals in the house: Copywriter, advertising in the family room: {Blank single:19197::"yes","no"} Carpet in the bedroom: {Blank single:19197::"yes","no"} Heating: {Blank single:19197::"electric","gas","heat pump"} Cooling: {Blank single:19197::"central","window","heat pump"} Pet: {Blank single:19197::"yes ***","no"}  Family History: Family History  Problem Relation Age of Onset  . Hypertension Father   . Diabetes Father   . Skin cancer Father        unknown type  . Gout Brother   . Hypertension Brother   . Pulmonary embolism Maternal Grandfather   . Diabetes Maternal Grandfather   . Prostate cancer Other        MGMs  brother dx in his 63s-80s   Problem                               Relation Asthma                                   *** Eczema                                *** Food allergy                          *** Allergic rhino conjunctivitis     ***  Review of Systems  Constitutional:  Negative for appetite change, chills, fever and unexpected weight change.  HENT:  Negative for congestion and rhinorrhea.   Eyes:  Negative for itching.  Respiratory:  Negative for cough, chest tightness, shortness of breath and wheezing.   Cardiovascular:  Negative for chest pain.  Gastrointestinal:  Negative for abdominal pain.  Genitourinary:  Negative for difficulty urinating.  Skin:  Negative for rash.  Neurological:  Negative for headaches.   Objective: There were no vitals taken for this visit. There is no height or weight on file to calculate BMI. Physical Exam Vitals and nursing note reviewed.  Constitutional:      Appearance: Normal appearance. She is well-developed.  HENT:  Head: Normocephalic and atraumatic.     Right Ear: Tympanic membrane and external ear normal.     Left Ear: Tympanic membrane and external ear normal.     Nose: Nose normal.     Mouth/Throat:     Mouth: Mucous membranes are moist.     Pharynx: Oropharynx is clear.  Eyes:     Conjunctiva/sclera: Conjunctivae normal.  Cardiovascular:     Rate and Rhythm: Normal rate and regular rhythm.     Heart sounds: Normal heart sounds. No murmur heard.    No friction rub. No gallop.  Pulmonary:     Effort: Pulmonary effort is normal.     Breath sounds: Normal breath sounds. No wheezing, rhonchi or rales.  Musculoskeletal:     Cervical back: Neck supple.  Skin:    General: Skin is warm.     Findings: No rash.  Neurological:     Mental Status: She is alert and oriented to person, place, and time.  Psychiatric:        Behavior: Behavior normal.  The plan was reviewed with the patient/family, and all questions/concerned were  addressed.  It was my pleasure to see Amber Pugh today and participate in her care. Please feel free to contact me with any questions or concerns.  Sincerely,  Wyline Mood, DO Allergy & Immunology  [facility] office: [phone] [facility] office: [phone]

## 2023-05-13 ENCOUNTER — Encounter: Payer: Self-pay | Admitting: Allergy

## 2023-05-13 ENCOUNTER — Other Ambulatory Visit: Payer: Self-pay

## 2023-05-13 ENCOUNTER — Ambulatory Visit (INDEPENDENT_AMBULATORY_CARE_PROVIDER_SITE_OTHER): Payer: BC Managed Care – PPO | Admitting: Allergy

## 2023-05-13 VITALS — BP 122/70 | HR 67 | Temp 99.1°F | Resp 12 | Ht 65.83 in | Wt 167.2 lb

## 2023-05-13 DIAGNOSIS — L309 Dermatitis, unspecified: Secondary | ICD-10-CM | POA: Diagnosis not present

## 2023-05-13 DIAGNOSIS — Z889 Allergy status to unspecified drugs, medicaments and biological substances status: Secondary | ICD-10-CM | POA: Diagnosis not present

## 2023-05-13 DIAGNOSIS — L509 Urticaria, unspecified: Secondary | ICD-10-CM

## 2023-05-13 DIAGNOSIS — R21 Rash and other nonspecific skin eruption: Secondary | ICD-10-CM | POA: Diagnosis not present

## 2023-05-13 DIAGNOSIS — T781XXD Other adverse food reactions, not elsewhere classified, subsequent encounter: Secondary | ICD-10-CM | POA: Insufficient documentation

## 2023-05-13 DIAGNOSIS — J3089 Other allergic rhinitis: Secondary | ICD-10-CM | POA: Insufficient documentation

## 2023-05-13 NOTE — Assessment & Plan Note (Signed)
Broke out in a itchy/burny rash 2 months ago that lasted for 2 weeks. No triggers noted. Concerned about new Atkins bar she was eating. No other changes in diet, meds, personal care products. Tried pepcid, benadryl, zyrtec with minimal benefit. 20 year ago broke out in hives and had skin testing which was positive to trees, mold and dust per patient. 1 other time broke out in hives after getting covid-19 and flu shot together 2 years ago. Medical history significant for Grave's disease and breast cancer. Etiology unclear - good news is that it resolved with no recurrence.  Keep track and take pictures. See below for proper skin care.  Get bloodwork.  If you have another episode of outbreak:  Start zyrtec (cetirizine) 10mg  twice a day. If symptoms are not controlled or causes drowsiness let us know. Start pepcid (famotidine) 20mg  twice a day.  Avoid the following potential triggers: alcohol, tight clothing, NSAIDs, hot showers and getting overheated.

## 2023-05-13 NOTE — Assessment & Plan Note (Signed)
Avoids gluten as it flares her Grave's. Cashews cause Gi issues, hazelnuts cause perioral pruritus and eggs cause joint aches. Today's skin testing was negative to select foods.  Avoid foods that bother you - gluten, eggs, cashews, hazelnuts. Some seem to be more of a intolerance rather than an IgE mediated food allergy.  For mild symptoms you can take over the counter antihistamines such as Benadryl and monitor symptoms closely. If symptoms worsen or if you have severe symptoms including breathing issues, throat closure, significant swelling, whole body hives, severe diarrhea and vomiting, lightheadedness then seek immediate medical care.

## 2023-05-13 NOTE — Patient Instructions (Addendum)
Skin:  Etiology unclear. Keep track and take pictures. See below for proper skin care.  Get bloodwork:  We are ordering labs, so please allow 1-2 weeks for the results to come back. With the newly implemented Cures Act, the labs might be visible to you at the same time that they become visible to me. However, I will not address the results until all of the results are back, so please be patient.   If you have another episode of outbreak:  Start zyrtec (cetirizine) 10mg  twice a day. If symptoms are not controlled or causes drowsiness let us know. Start pepcid (famotidine) 20mg  twice a day.  Avoid the following potential triggers: alcohol, tight clothing, NSAIDs, hot showers and getting overheated.  Environmental See below for environmental control measures. Use over the counter antihistamines such as Zyrtec (cetirizine), Claritin (loratadine), Allegra (fexofenadine), or Xyzal (levocetirizine) daily as needed. May take twice a day during allergy flares. May switch antihistamines every few months.  Drug allergy: Continue to avoid penicillin and azithromycin.  Consider penicillin allergy skin testing and in office drug challenge first and then azithromycin.   You must be off antihistamines for 3-5 days before. Plan on being in the office for 2-3 hours. You must call to schedule an appointment and specify it's for a drug challenge.  A few days prior to the appointment, I will send in a prescription for amoxicillin liquid which you must bring to the appointment as well.   Vaccines We can do challenge dosing to flu and Covid-19 boosters in the future if interested.  Foods Avoid foods that bother you - gluten, eggs, cashews, hazelnuts. For mild symptoms you can take over the counter antihistamines such as Benadryl and monitor symptoms closely. If symptoms worsen or if you have severe symptoms including breathing issues, throat closure, significant swelling, whole body hives, severe diarrhea and  vomiting, lightheadedness then seek immediate medical care.  Follow up for penicillin skin testing.   Skin care recommendations  Bath time: Always use lukewarm water. AVOID very hot or cold water. Keep bathing time to 5-10 minutes. Do NOT use bubble bath. Use a mild soap and use just enough to wash the dirty areas. Do NOT scrub skin vigorously.  After bathing, pat dry your skin with a towel. Do NOT rub or scrub the skin.  Moisturizers and prescriptions:  ALWAYS apply moisturizers immediately after bathing (within 3 minutes). This helps to lock-in moisture. Use the moisturizer several times a day over the whole body. Good summer moisturizers include: Aveeno, CeraVe, Cetaphil. Good winter moisturizers include: Aquaphor, Vaseline, Cerave, Cetaphil, Eucerin, Vanicream. When using moisturizers along with medications, the moisturizer should be applied about one hour after applying the medication to prevent diluting effect of the medication or moisturize around where you applied the medications. When not using medications, the moisturizer can be continued twice daily as maintenance.  Laundry and clothing: Avoid laundry products with added color or perfumes. Use unscented hypo-allergenic laundry products such as Tide free, Cheer free & gentle, and All free and clear.  If the skin still seems dry or sensitive, you can try double-rinsing the clothes. Avoid tight or scratchy clothing such as wool. Do not use fabric softeners or dyer sheets.   Reducing Pollen Exposure Pollen seasons: trees (spring), grass (summer) and ragweed/weeds (fall). Keep windows closed in your home and car to lower pollen exposure.  Install air conditioning in the bedroom and throughout the house if possible.  Avoid going out in dry windy days -  especially early morning. Pollen counts are highest between 5 - 10 AM and on dry, hot and windy days.  Save outside activities for late afternoon or after a heavy rain, when  pollen levels are lower.  Avoid mowing of grass if you have grass pollen allergy. Be aware that pollen can also be transported indoors on people and pets.  Dry your clothes in an automatic dryer rather than hanging them outside where they might collect pollen.  Rinse hair and eyes before bedtime. Mold Control Mold and fungi can grow on a variety of surfaces provided certain temperature and moisture conditions exist.  Outdoor molds grow on plants, decaying vegetation and soil. The major outdoor mold, Alternaria and Cladosporium, are found in very high numbers during hot and dry conditions. Generally, a late summer - fall peak is seen for common outdoor fungal spores. Rain will temporarily lower outdoor mold spore count, but counts rise rapidly when the rainy period ends. The most important indoor molds are Aspergillus and Penicillium. Dark, humid and poorly ventilated basements are ideal sites for mold growth. The next most common sites of mold growth are the bathroom and the kitchen. Outdoor (Seasonal) Mold Control Use air conditioning and keep windows closed. Avoid exposure to decaying vegetation. Avoid leaf raking. Avoid grain handling. Consider wearing a face mask if working in moldy areas.  Indoor (Perennial) Mold Control  Maintain humidity below 50%. Get rid of mold growth on hard surfaces with water, detergent and, if necessary, 5% bleach (do not mix with other cleaners). Then dry the area completely. If mold covers an area more than 10 square feet, consider hiring an indoor environmental professional. For clothing, washing with soap and water is best. If moldy items cannot be cleaned and dried, throw them away. Remove sources e.g. contaminated carpets. Repair and seal leaking roofs or pipes. Using dehumidifiers in damp basements may be helpful, but empty the water and clean units regularly to prevent mildew from forming. All rooms, especially basements, bathrooms and kitchens, require  ventilation and cleaning to deter mold and mildew growth. Avoid carpeting on concrete or damp floors, and storing items in damp areas. Control of House Dust Mite Allergen Dust mite allergens are a common trigger of allergy and asthma symptoms. While they can be found throughout the house, these microscopic creatures thrive in warm, humid environments such as bedding, upholstered furniture and carpeting. Because so much time is spent in the bedroom, it is essential to reduce mite levels there.  Encase pillows, mattresses, and box springs in special allergen-proof fabric covers or airtight, zippered plastic covers.  Bedding should be washed weekly in hot water (130 F) and dried in a hot dryer. Allergen-proof covers are available for comforters and pillows that can't be regularly washed.  Wash the allergy-proof covers every few months. Minimize clutter in the bedroom. Keep pets out of the bedroom.  Keep humidity less than 50% by using a dehumidifier or air conditioning. You can buy a humidity measuring device called a hygrometer to monitor this.  If possible, replace carpets with hardwood, linoleum, or washable area rugs. If that's not possible, vacuum frequently with a vacuum that has a HEPA filter. Remove all upholstered furniture and non-washable window drapes from the bedroom. Remove all non-washable stuffed toys from the bedroom.  Wash stuffed toys weekly.

## 2023-05-13 NOTE — Assessment & Plan Note (Signed)
Facial swelling after taking azithromycin for 2 days then given amoxicillin and still had the swelling. Previously took these meds with no issues. Whole body hives after covid-19 and flu vaccine 2 years ago. No issues with vaccines previously.   Continue to avoid penicillin and azithromycin. Consider penicillin allergy skin testing and in office drug challenge first and then azithromycin.   You must be off antihistamines for 3-5 days before. Plan on being in the office for 2-3 hours. You must call to schedule an appointment and specify it's for a drug challenge.  A few days prior to the appointment, I will send in a prescription for amoxicillin liquid which you must bring to the appointment as well.  Discussed challenge dosing to flu and Covid-19 boosters in the future if interested - patient usually doesn't get the flu shot.

## 2023-05-13 NOTE — Assessment & Plan Note (Signed)
Takes zyrtec daily with some benefit. Skin testing over 20 years ago was positive to tree pollen, mold and dust mites per patient.  See below for environmental control measures. Use over the counter antihistamines such as Zyrtec (cetirizine), Claritin (loratadine), Allegra (fexofenadine), or Xyzal (levocetirizine) daily as needed. May take twice a day during allergy flares. May switch antihistamines every few months. Declined environmental testing as it was not her main concern today.

## 2023-05-14 LAB — TRYPTASE

## 2023-05-14 LAB — CBC WITH DIFFERENTIAL/PLATELET: Basos: 1 %

## 2023-05-15 LAB — ENA+DNA/DS+SJORGEN'S: dsDNA Ab: 4 IU/mL (ref 0–9)

## 2023-05-15 LAB — CBC WITH DIFFERENTIAL/PLATELET
Immature Grans (Abs): 0 10*3/uL (ref 0.0–0.1)
Immature Granulocytes: 0 %

## 2023-05-16 LAB — COMPREHENSIVE METABOLIC PANEL: Potassium: 4.3 mmol/L (ref 3.5–5.2)

## 2023-05-16 LAB — ENA+DNA/DS+SJORGEN'S: ENA SM Ab Ser-aCnc: <0.2 AI (ref 0.0–0.9)

## 2023-05-21 ENCOUNTER — Encounter: Payer: Self-pay | Admitting: Allergy

## 2023-05-22 LAB — ALPHA-GAL PANEL
Allergen Lamb IgE: <0.1 kU/L
Beef IgE: <0.1 kU/L
IgE (Immunoglobulin E), Serum: 17 IU/mL (ref 6–495)
O215-IgE Alpha-Gal: <0.1 kU/L
Pork IgE: <0.1 kU/L

## 2023-05-22 LAB — ANA W/REFLEX: Anti Nuclear Antibody (ANA): POSITIVE — AB

## 2023-05-22 LAB — CBC WITH DIFFERENTIAL/PLATELET
Basophils Absolute: 0.1 10*3/uL (ref 0.0–0.2)
EOS (ABSOLUTE): 0.1 10*3/uL (ref 0.0–0.4)
Eos: 2 %
Hematocrit: 43 % (ref 34.0–46.6)
Hemoglobin: 13.9 g/dL (ref 11.1–15.9)
Lymphocytes Absolute: 2.1 10*3/uL (ref 0.7–3.1)
Lymphs: 35 %
MCH: 30.2 pg (ref 26.6–33.0)
MCHC: 32.3 g/dL (ref 31.5–35.7)
MCV: 94 fL (ref 79–97)
Monocytes Absolute: 0.4 10*3/uL (ref 0.1–0.9)
Monocytes: 7 %
Neutrophils Absolute: 3.3 10*3/uL (ref 1.4–7.0)
Neutrophils: 55 %
Platelets: 214 10*3/uL (ref 150–450)
RBC: 4.6 x10E6/uL (ref 3.77–5.28)
RDW: 12 % (ref 11.7–15.4)
WBC: 6 10*3/uL (ref 3.4–10.8)

## 2023-05-22 LAB — C-REACTIVE PROTEIN: CRP: <1 mg/L (ref 0–10)

## 2023-05-22 LAB — COMPREHENSIVE METABOLIC PANEL
ALT: 24 IU/L (ref 0–32)
AST: 20 IU/L (ref 0–40)
Albumin/Globulin Ratio: 1.6 (ref 1.2–2.2)
Albumin: 4.5 g/dL (ref 3.9–4.9)
Alkaline Phosphatase: 53 IU/L (ref 44–121)
BUN/Creatinine Ratio: 13 (ref 9–23)
BUN: 10 mg/dL (ref 6–24)
Bilirubin Total: 0.2 mg/dL (ref 0.0–1.2)
CO2: 21 mmol/L (ref 20–29)
Calcium: 9.3 mg/dL (ref 8.7–10.2)
Chloride: 101 mmol/L (ref 96–106)
Creatinine, Ser: 0.78 mg/dL (ref 0.57–1.00)
Globulin, Total: 2.8 g/dL (ref 1.5–4.5)
Glucose: 101 mg/dL — ABNORMAL HIGH (ref 70–99)
Sodium: 138 mmol/L (ref 134–144)
Total Protein: 7.3 g/dL (ref 6.0–8.5)
eGFR: 93 mL/min/{1.73_m2} (ref >59)

## 2023-05-22 LAB — C3 AND C4
Complement C3, Serum: 119 mg/dL (ref 82–167)
Complement C4, Serum: 31 mg/dL (ref 12–38)

## 2023-05-22 LAB — ENA+DNA/DS+SJORGEN'S
ENA RNP Ab: <0.2 AI (ref 0.0–0.9)
ENA SSA (RO) Ab: 1.1 AI — ABNORMAL HIGH (ref 0.0–0.9)
ENA SSB (LA) Ab: <0.2 AI (ref 0.0–0.9)

## 2023-05-22 LAB — THYROID CASCADE PROFILE: TSH: 3.5 u[IU]/mL (ref 0.450–4.500)

## 2023-05-22 LAB — CHRONIC URTICARIA: cu index: 4.3 (ref <10)

## 2023-05-22 LAB — SEDIMENTATION RATE: Sed Rate: 9 mm/hr (ref 0–32)

## 2023-06-10 NOTE — Progress Notes (Signed)
[address] [place] [address] Dept: [phone]  FOLLOW UP NOTE  Patient ID: Amber Pugh, female    DOB: 1971/05/05  Age: 52 y.o. MRN: [id number] Date of Office Visit: 06/11/2023  Assessment  Chief Complaint: Food/Drug Challenge (penicillin)  HPI Amber Pugh is a 52 year old female who presents to the clinic for follow-up visit.  She was last seen in this clinic on 05/13/2023 by Dr. Selena Batten for evaluation of dermatitis, multiple drug allergies, and allergic rhinitis.  At today's visit, she reports that she is feeling well overall with no cardiopulmonary, gastrointestinal, or integumentary symptoms.  She has not had any antihistamines over the last 5 days.  Her current medications are listed in the chart.   Drug Allergies:  Allergies  Allergen Reactions   Azithromycin     Other Reaction(s): raw tongue; face swelling   Cefdinir     GI upset, nausea,vomiting & diarrhea (Omnicef)   Tetracyclines & Related Other (See Comments)    Headache,nausea, stomach ulcer    Physical Exam: BP 100/60   Pulse 60   Temp 98.5 F (36.9 C) (Temporal)   Resp 16   Wt 163 lb 12.8 oz (74.3 kg)   SpO2 100%   BMI 26.58 kg/m    Physical Exam Vitals reviewed.  Constitutional:      Appearance: Normal appearance.  HENT:     Head: Normocephalic and atraumatic.     Right Ear: Tympanic membrane normal.     Left Ear: Tympanic membrane normal.     Nose:     Comments: Bilateral nares slightly erythematous with thin clear nasal drainage noted.  Pharynx normal.  No tongue or oropharyngeal swelling.  Ears normal.  Eyes normal.    Mouth/Throat:     Pharynx: Oropharynx is clear.  Eyes:     Conjunctiva/sclera: Conjunctivae normal.  Cardiovascular:     Rate and Rhythm: Normal rate and regular rhythm.     Heart sounds: Normal heart sounds. No murmur heard. Pulmonary:     Effort: Pulmonary effort is normal.     Breath sounds: Normal breath sounds.     Comments: Lungs clear to  auscultation Musculoskeletal:        General: Normal range of motion.     Cervical back: Normal range of motion and neck supple.  Skin:    General: Skin is warm and dry.  Neurological:     Mental Status: She is alert and oriented to person, place, and time.  Psychiatric:        Mood and Affect: Mood normal.        Behavior: Behavior normal.        Thought Content: Thought content normal.        Judgment: Judgment normal.     Diagnostics: Procedure note Percutaneous Penicillin Testing Control SPT: 3+ Histamine SPT: negative Pre-Pen SPT: negative Pen-G SPT: negative  Intradermal Penicillin Testing Control ID: negative  Pre-Pen ID: negative Pen-G (50 units/mL) ID: negative Pen-G (500 units/mL) ID: negative Pen-G (5000 units/mL) ID: negative  Skin testing was negative, therefore the patient will proceed with a penicillin challenge Open graded amoxicillin oral challenge: The patient was not able to tolerate the challenge today.  She was able to tolerate the !% dose without report of symptoms. She reported a tingly feeling under her tongue and along her gumline after the 10% dose.  Physical exam and vital signs were within normal limits.  After 30 minutes, the 10% dose was repeated.  Immediately after  the second 10% dose she began to experience tongue swelling.  Physical exam and vital signs were within normal limits.  She was given Benadryl 50 mg and famotidine 20 mg.  The swelling sensation continued and she was given epinephrine 0.3 mg IM one-time.  Vital signs remained stable and physical exam remained normal.  Patient reported immediate relief of tongue swelling after epinephrine injection was given.  She was monitored for 60 minutes after epinephrine was given.  Upon discharge, physical exam normal and vital signs normal.  Patient called when she arrived home and reported no physical symptoms including tongue swelling. Total oral testing time: 137 minutes  Assessment and Plan: 1.  Drug allergy     Meds ordered this encounter  Medications   diphenhydrAMINE (BENADRYL) capsule 50 mg   famotidine (PEPCID) tablet 20 mg   EPINEPHrine (EPI-PEN) injection 0.3 mg    Patient Instructions  Penicillin allergy Continue to avoid penicillin and drugs containing penicillin  Azithromycin Continue to avoid azithromycin. Return to the clinic for azithromycin testing in the future  Call the clinic if this treatment plan is not working well for you  Follow up in 3 months or sooner if needed.   Return in about 3 months (around 09/11/2023), or if symptoms worsen or fail to improve.    Thank you for the opportunity to care for this patient.  Please do not hesitate to contact me with questions.  Thermon Leyland, FNP [facility]

## 2023-06-11 ENCOUNTER — Other Ambulatory Visit: Payer: Self-pay

## 2023-06-11 ENCOUNTER — Ambulatory Visit (INDEPENDENT_AMBULATORY_CARE_PROVIDER_SITE_OTHER): Payer: BC Managed Care – PPO | Admitting: Family Medicine

## 2023-06-11 ENCOUNTER — Telehealth: Payer: Self-pay | Admitting: Family Medicine

## 2023-06-11 ENCOUNTER — Encounter: Payer: Self-pay | Admitting: Family Medicine

## 2023-06-11 ENCOUNTER — Telehealth: Payer: Self-pay

## 2023-06-11 VITALS — BP 100/60 | HR 60 | Temp 98.5°F | Resp 16 | Wt 163.8 lb

## 2023-06-11 DIAGNOSIS — Z880 Allergy status to penicillin: Secondary | ICD-10-CM | POA: Diagnosis not present

## 2023-06-11 DIAGNOSIS — Z889 Allergy status to unspecified drugs, medicaments and biological substances status: Secondary | ICD-10-CM

## 2023-06-11 MED ORDER — DIPHENHYDRAMINE HCL 25 MG PO CAPS: 50.0000 mg | ORAL_CAPSULE | Freq: Once | ORAL | Status: AC

## 2023-06-11 MED ORDER — EPINEPHRINE 0.3 MG/0.3ML IJ SOAJ: 0.3000 mg | Freq: Once | INTRAMUSCULAR | Status: AC

## 2023-06-11 MED ORDER — FAMOTIDINE 20 MG PO TABS: 20.0000 mg | ORAL_TABLET | Freq: Once | ORAL | Status: AC

## 2023-06-11 NOTE — Telephone Encounter (Signed)
Called patient to find out how she was doing after her reaction to testing in office. Patient stated that she was a little sleepy but fine.

## 2023-06-11 NOTE — Telephone Encounter (Signed)
Patient reports her tongue swelling has resolved and she is not currently experiencing any other symptoms of anaphylaxis.

## 2023-06-11 NOTE — Patient Instructions (Signed)
Penicillin allergy Continue to avoid penicillin and drugs containing penicillin  Azithromycin Continue to avoid azithromycin. Return to the clinic for azithromycin testing in the future  Call the clinic if this treatment plan is not working well for you  Follow up in 3 months or sooner if needed.
